# Patient Record
Sex: Female | Born: 1980 | Race: Black or African American | Hispanic: No | Marital: Single | Smoking: Former smoker
Health system: Southern US, Community
[De-identification: ages and names within clinical notes are randomized; demographics above are authoritative.]

## PROBLEM LIST (undated history)

## (undated) ENCOUNTER — Inpatient Hospital Stay: Payer: Self-pay

## (undated) DIAGNOSIS — I499 Cardiac arrhythmia, unspecified: Secondary | ICD-10-CM

## (undated) DIAGNOSIS — F3130 Bipolar disorder, current episode depressed, mild or moderate severity, unspecified: Secondary | ICD-10-CM

## (undated) DIAGNOSIS — F411 Generalized anxiety disorder: Secondary | ICD-10-CM

## (undated) DIAGNOSIS — F1210 Cannabis abuse, uncomplicated: Secondary | ICD-10-CM

## (undated) DIAGNOSIS — O09529 Supervision of elderly multigravida, unspecified trimester: Secondary | ICD-10-CM

## (undated) DIAGNOSIS — F4310 Post-traumatic stress disorder, unspecified: Secondary | ICD-10-CM

## (undated) HISTORY — PX: NO PAST SURGERIES: SHX2092

---

## 2013-07-25 ENCOUNTER — Emergency Department: Payer: Self-pay | Admitting: Emergency Medicine

## 2014-02-04 ENCOUNTER — Emergency Department: Payer: Self-pay | Admitting: Emergency Medicine

## 2015-02-16 ENCOUNTER — Emergency Department
Admission: EM | Admit: 2015-02-16 | Discharge: 2015-02-16 | Disposition: A | Discharge status: Home / Self Care | Payer: Self-pay | Attending: Emergency Medicine | Admitting: Emergency Medicine

## 2015-02-16 ENCOUNTER — Encounter: Payer: Self-pay | Admitting: Emergency Medicine

## 2015-02-16 DIAGNOSIS — Z3202 Encounter for pregnancy test, result negative: Secondary | ICD-10-CM | POA: Insufficient documentation

## 2015-02-16 DIAGNOSIS — F32A Depression, unspecified: Secondary | ICD-10-CM

## 2015-02-16 DIAGNOSIS — F329 Major depressive disorder, single episode, unspecified: Secondary | ICD-10-CM | POA: Insufficient documentation

## 2015-02-16 DIAGNOSIS — F419 Anxiety disorder, unspecified: Secondary | ICD-10-CM | POA: Insufficient documentation

## 2015-02-16 DIAGNOSIS — Z720 Tobacco use: Secondary | ICD-10-CM | POA: Insufficient documentation

## 2015-02-16 DIAGNOSIS — Z79899 Other long term (current) drug therapy: Secondary | ICD-10-CM | POA: Insufficient documentation

## 2015-02-16 HISTORY — DX: Post-traumatic stress disorder, unspecified: F43.10

## 2015-02-16 HISTORY — DX: Cardiac arrhythmia, unspecified: I49.9

## 2015-02-16 LAB — COMPREHENSIVE METABOLIC PANEL
ALT: 12 U/L — AB (ref 14–54)
AST: 18 U/L (ref 15–41)
Albumin: 4.7 g/dL (ref 3.5–5.0)
Alkaline Phosphatase: 47 U/L (ref 38–126)
Anion gap: 11 (ref 5–15)
BILIRUBIN TOTAL: 0.7 mg/dL (ref 0.3–1.2)
BUN: 11 mg/dL (ref 6–20)
CHLORIDE: 105 mmol/L (ref 101–111)
CO2: 22 mmol/L (ref 22–32)
Calcium: 9.5 mg/dL (ref 8.9–10.3)
Creatinine, Ser: 0.86 mg/dL (ref 0.44–1.00)
GFR calc Af Amer: >60 mL/min (ref >60)
Glucose, Bld: 84 mg/dL (ref 65–99)
Potassium: 3.3 mmol/L — ABNORMAL LOW (ref 3.5–5.1)
Sodium: 138 mmol/L (ref 135–145)
Total Protein: 8.6 g/dL — ABNORMAL HIGH (ref 6.5–8.1)

## 2015-02-16 LAB — URINALYSIS COMPLETE WITH MICROSCOPIC (ARMC ONLY)
Bacteria, UA: NONE SEEN
Bilirubin Urine: NEGATIVE
Glucose, UA: NEGATIVE mg/dL
HGB URINE DIPSTICK: NEGATIVE
Leukocytes, UA: NEGATIVE
Nitrite: NEGATIVE
PH: 6 (ref 5.0–8.0)
Protein, ur: NEGATIVE mg/dL
Specific Gravity, Urine: 1.013 (ref 1.005–1.030)

## 2015-02-16 LAB — CBC
HEMATOCRIT: 40.2 % (ref 35.0–47.0)
HEMOGLOBIN: 13.1 g/dL (ref 12.0–16.0)
MCH: 27.9 pg (ref 26.0–34.0)
MCHC: 32.6 g/dL (ref 32.0–36.0)
MCV: 85.5 fL (ref 80.0–100.0)
Platelets: 216 10*3/uL (ref 150–440)
RBC: 4.7 MIL/uL (ref 3.80–5.20)
RDW: 14.8 % — ABNORMAL HIGH (ref 11.5–14.5)
WBC: 7 10*3/uL (ref 3.6–11.0)

## 2015-02-16 LAB — URINE DRUG SCREEN, QUALITATIVE (ARMC ONLY)
Amphetamines, Ur Screen: NOT DETECTED
Barbiturates, Ur Screen: NOT DETECTED
Benzodiazepine, Ur Scrn: NOT DETECTED
CANNABINOID 50 NG, UR ~~LOC~~: POSITIVE — AB
Cocaine Metabolite,Ur ~~LOC~~: POSITIVE — AB
MDMA (ECSTASY) UR SCREEN: NOT DETECTED
Methadone Scn, Ur: NOT DETECTED
Opiate, Ur Screen: NOT DETECTED
Phencyclidine (PCP) Ur S: NOT DETECTED
TRICYCLIC, UR SCREEN: NOT DETECTED

## 2015-02-16 LAB — ACETAMINOPHEN LEVEL

## 2015-02-16 LAB — PREGNANCY, URINE: Preg Test, Ur: NEGATIVE

## 2015-02-16 LAB — ETHANOL

## 2015-02-16 LAB — SALICYLATE LEVEL

## 2015-02-16 MED ORDER — LORAZEPAM 1 MG PO TABS
1.0000 mg | ORAL_TABLET | Freq: Once | ORAL | Status: AC
  Administered 2015-02-16: 1 mg via ORAL

## 2015-02-16 MED ORDER — LORAZEPAM 1 MG PO TABS
ORAL_TABLET | ORAL | Status: AC
  Administered 2015-02-16: 1 mg via ORAL
  Filled 2015-02-16: qty 1

## 2015-02-16 NOTE — ED Notes (Signed)
Pt presents to ER alert and in NAD. Pt states she is having palpitations and CP. Pt reports depression and decreased appetite.
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
## 2015-02-16 NOTE — ED Notes (Signed)
Patient states that she is feeling better and calmer now

## 2015-02-16 NOTE — Discharge Instructions (Signed)
Please seek medical attention and help for any thoughts about wanting to harm herself, harm others, any concerning change in behavior, severe depression, inappropriate drug use or any other new or concerning symptoms.  Depression Depression is feeling sad, low, down in the dumps, blue, gloomy, or empty. In general, there are two kinds of depression:  Normal sadness or grief. This can happen after something upsetting. It often goes away on its own within 2 weeks. After losing a loved one (bereavement), normal sadness and grief may last longer than two weeks. It usually gets better with time.  Clinical depression. This kind lasts longer than normal sadness or grief. It keeps you from doing the things you normally do in life. It is often hard to function at home, work, or at school. It may affect your relationships with others. Treatment is often needed. GET HELP RIGHT AWAY IF:  You have thoughts about hurting yourself or others.  You lose touch with reality (psychotic symptoms). You may:  See or hear things that are not real.  Have untrue beliefs about your life or people around you.  Your medicine is giving you problems. MAKE SURE YOU:  Understand these instructions.  Will watch your condition.  Will get help right away if you are not doing well or get worse. Document Released: 10/08/2010 Document Revised: 01/20/2014 Document Reviewed: 01/05/2012 [facility] Patient Information 2015 [place], [place]. This information is not intended to replace advice given to you by your health care provider. Make sure you discuss any questions you have with your health care provider.

## 2015-02-16 NOTE — ED Notes (Signed)
Pt alert and oriented X4, active, cooperative, pt in NAD. RR even and unlabored, color WNL.  Pt informed to return if any life threatening symptoms occur.  Pt leaving with family per report. Pt refuses repeat VS before discharge.

## 2015-02-16 NOTE — ED Notes (Signed)
Pt says she has been taking her paxil and an herbal med, but for last two days has been out of control.  Cannot sleep--afraid to close eyes, also cannot eat. Pt tearful.  Says she sees lincoln in [place] for this.

## 2015-02-17 ENCOUNTER — Telehealth: Payer: Self-pay | Admitting: Emergency Medicine

## 2015-02-17 NOTE — ED Notes (Signed)
Pt says she did not get her work note for yesterday and today.  Will leave note at stat desk and she will pick up tomorrow.

## 2015-09-20 NOTE — L&D Delivery Note (Signed)
Delivery Note  EGA: 37.1  At 10:07 AM a viable female was delivered via Vaginal, Spontaneous Delivery (Presentation: LOA).  APGAR: 8, 8; weight 6 lb 9.5 oz (2990 g).   Placenta status: Intact, Spontaneous.  Cord: 3 vessels with the following complications: none apparent.  Cord pH: n/a  Anesthesia: Epidural  Episiotomy:  none Lacerations: 1st degree - shallow, not repaired Suture Repair: n/a Est. Blood Loss (mL):  150cc  Mom to postpartum.  Baby to Couplet care / Skin to Skin.  Mom presented to L&D with spontaneous rupture of membranes and labor.  She requested an epidural, and progressed to complete.  She had a <1hr second stage and delivered a viable female "Ariah" without difficulty.  Baby was placed on mom's chest and after 60 seconds of delayed cord clamping, it was doubly clamped and mom cut the cord!  Cord blood was collected.  Pitocin was started, and placenta delivered spontaneously, intact.  A shallow and hemostatic first degree laceration was noted but not repaired.  After attention from the pediatric team, the baby was placed on mom's chest for skin-to-skin.   Sunya Humbarger C Anaily Ashbaugh 03/27/2016, 11:36 PM

## 2015-10-21 ENCOUNTER — Encounter: Payer: Self-pay | Admitting: Emergency Medicine

## 2015-10-21 ENCOUNTER — Emergency Department
Admission: EM | Admit: 2015-10-21 | Discharge: 2015-10-21 | Disposition: A | Discharge status: Home / Self Care | Payer: Medicaid Other | Attending: Emergency Medicine | Admitting: Emergency Medicine

## 2015-10-21 ENCOUNTER — Emergency Department: Payer: Medicaid Other

## 2015-10-21 DIAGNOSIS — Z87891 Personal history of nicotine dependence: Secondary | ICD-10-CM | POA: Insufficient documentation

## 2015-10-21 DIAGNOSIS — O200 Threatened abortion: Secondary | ICD-10-CM | POA: Diagnosis not present

## 2015-10-21 DIAGNOSIS — Z3A12 12 weeks gestation of pregnancy: Secondary | ICD-10-CM | POA: Diagnosis not present

## 2015-10-21 DIAGNOSIS — N939 Abnormal uterine and vaginal bleeding, unspecified: Secondary | ICD-10-CM

## 2015-10-21 DIAGNOSIS — O209 Hemorrhage in early pregnancy, unspecified: Secondary | ICD-10-CM | POA: Diagnosis present

## 2015-10-21 LAB — COMPREHENSIVE METABOLIC PANEL
ALBUMIN: 3.5 g/dL (ref 3.5–5.0)
ALT: 12 U/L — AB (ref 14–54)
ANION GAP: 6 (ref 5–15)
AST: 16 U/L (ref 15–41)
Alkaline Phosphatase: 39 U/L (ref 38–126)
BUN: 5 mg/dL — ABNORMAL LOW (ref 6–20)
CO2: 22 mmol/L (ref 22–32)
Calcium: 8.7 mg/dL — ABNORMAL LOW (ref 8.9–10.3)
Chloride: 107 mmol/L (ref 101–111)
Creatinine, Ser: 0.52 mg/dL (ref 0.44–1.00)
GFR calc Af Amer: >60 mL/min (ref >60)
GFR calc non Af Amer: >60 mL/min (ref >60)
Glucose, Bld: 88 mg/dL (ref 65–99)
Potassium: 3.4 mmol/L — ABNORMAL LOW (ref 3.5–5.1)
Sodium: 135 mmol/L (ref 135–145)
Total Bilirubin: 0.4 mg/dL (ref 0.3–1.2)
Total Protein: 6.7 g/dL (ref 6.5–8.1)

## 2015-10-21 LAB — CBC WITH DIFFERENTIAL/PLATELET
BASOS PCT: 0 %
Basophils Absolute: 0 10*3/uL (ref 0–0.1)
EOS ABS: 0.3 10*3/uL (ref 0–0.7)
EOS PCT: 5 %
HCT: 30.3 % — ABNORMAL LOW (ref 35.0–47.0)
HEMOGLOBIN: 10.2 g/dL — AB (ref 12.0–16.0)
Lymphocytes Relative: 15 %
Lymphs Abs: 1.1 10*3/uL (ref 1.0–3.6)
MCH: 28.8 pg (ref 26.0–34.0)
MCHC: 33.6 g/dL (ref 32.0–36.0)
MCV: 85.8 fL (ref 80.0–100.0)
Monocytes Absolute: 0.5 10*3/uL (ref 0.2–0.9)
Monocytes Relative: 7 %
NEUTROS PCT: 73 %
Neutro Abs: 5.6 10*3/uL (ref 1.4–6.5)
Platelets: 204 10*3/uL (ref 150–440)
RBC: 3.52 MIL/uL — AB (ref 3.80–5.20)
RDW: 15.8 % — ABNORMAL HIGH (ref 11.5–14.5)
WBC: 7.6 10*3/uL (ref 3.6–11.0)

## 2015-10-21 LAB — URINALYSIS COMPLETE WITH MICROSCOPIC (ARMC ONLY)
Bilirubin Urine: NEGATIVE
Glucose, UA: NEGATIVE mg/dL
HGB URINE DIPSTICK: NEGATIVE
KETONES UR: NEGATIVE mg/dL
LEUKOCYTES UA: NEGATIVE
Nitrite: NEGATIVE
PH: 8 (ref 5.0–8.0)
PROTEIN: NEGATIVE mg/dL
SPECIFIC GRAVITY, URINE: 1.013 (ref 1.005–1.030)

## 2015-10-21 LAB — ABO/RH: ABO/RH(D): O POS

## 2015-10-21 LAB — HCG, QUANTITATIVE, PREGNANCY: HCG, BETA CHAIN, QUANT, S: 14626 m[IU]/mL — AB (ref <5)

## 2015-10-21 NOTE — Discharge Instructions (Signed)
Vaginal Bleeding During Pregnancy, Second Trimester ° A small amount of bleeding (spotting) from the vagina is common in pregnancy. Sometimes the bleeding is normal and is not a problem, and sometimes it is a sign of something serious. Be sure to tell your doctor about any bleeding from your vagina right away. °HOME CARE °· Watch your condition for any changes. °· Follow your doctor's instructions about how active you can be. °· If you are on bed rest: °¨ You may need to stay in bed and only get up to use the bathroom. °¨ You may be allowed to do some activities. °¨ If you need help, make plans for someone to help you. °· Write down: °¨ The number of pads you use each day. °¨ How often you change pads. °¨ How soaked (saturated) your pads are. °· Do not use tampons. °· Do not douche. °· Do not have sex or orgasms until your doctor says it is okay. °· If you pass any tissue from your vagina, save the tissue so you can show it to your doctor. °· Only take medicines as told by your doctor. °· Do not take aspirin because it can make you bleed. °· Do not exercise, lift heavy weights, or do any activities that take a lot of energy and effort unless your doctor says it is okay. °· Keep all follow-up visits as told by your doctor. °GET HELP IF:  °· You bleed from your vagina. °· You have cramps. °· You have labor pains. °· You have a fever that does not go away after you take medicine. °GET HELP RIGHT AWAY IF: °· You have very bad cramps in your back or belly (abdomen). °· You have contractions. °· You have chills. °· You pass large clots or tissue from your vagina. °· You bleed more. °· You feel light-headed or weak. °· You pass out (faint). °· You are leaking fluid or have a gush of fluid from your vagina. °MAKE SURE YOU: °· Understand these instructions. °· Will watch your condition. °· Will get help right away if you are not doing well or get worse. °  °This information is not intended to replace advice given to you by  your health care provider. Make sure you discuss any questions you have with your health care provider. °  °Document Released: 01/20/2014 Document Reviewed: 01/20/2014 °Elsevier Interactive Patient Education ©2016 [employer] ° °

## 2015-10-21 NOTE — ED Notes (Signed)
Pt to ed with c/o vaginal bleeding,  Pt is spprox [redacted] weeks pregnant and has not been evaluated by OBGYN yet.  Pt reports abd pain and cramping and bleeding that started this am.

## 2015-10-21 NOTE — ED Notes (Signed)
Pt returned from ultrasound

## 2015-10-21 NOTE — ED Notes (Signed)
Patient transported to Ultrasound 

## 2015-10-21 NOTE — ED Provider Notes (Signed)
[facility] Emergency Department Provider Note     Time seen: ----------------------------------------- 8:38 AM on 10/21/2015 -----------------------------------------    I have reviewed the triage vital signs and the nursing notes.   HISTORY  Chief Complaint Abdominal Pain and Vaginal Bleeding    HPI Pamela Mcintyre is a 35 y.o. female who presents ER with vaginal bleeding. Patient states she is around 34 pregnant has not been evaluated by OB/GYN yet. Does report abdominal pain and cramping and bleeding that started this morning.Patient is G5 P3 AB 2. Prior twin pregnancy. She denies fevers chills or other complaints.   Past Medical History  Diagnosis Date  . PTSD (post-traumatic stress disorder)   . Irregular heart beat     There are no active problems to display for this patient.   History reviewed. No pertinent past surgical history.  Allergies Review of patient's allergies indicates no known allergies.  Social History Social History  Substance Use Topics  . Smoking status: Former Research scientist (life sciences)  . Smokeless tobacco: None  . Alcohol Use: No    Review of Systems Constitutional: Negative for fever. Eyes: Negative for visual changes. ENT: Negative for sore throat. Cardiovascular: Negative for chest pain. Respiratory: Negative for shortness of breath. Gastrointestinal: Positive for abdominal pain Genitourinary: Negative for dysuria. Positive for vaginal bleeding Musculoskeletal: Negative for back pain. Skin: Negative for rash. Neurological: Negative for headaches, focal weakness or numbness.  10-point ROS otherwise negative.  ____________________________________________   PHYSICAL EXAM:  VITAL SIGNS: ED Triage Vitals  Enc Vitals Group     BP 10/21/15 0831 125/70 mmHg     Pulse Rate 10/21/15 0831 89     Resp 10/21/15 0831 20     Temp 10/21/15 0831 98.2 F (36.8 C)     Temp Source 10/21/15 0831 Oral     SpO2 10/21/15 0831 100 %      Weight 10/21/15 0831 120 lb (54.432 kg)     Height 10/21/15 0831 5\' 4"  (1.626 m)     Head Cir --      Peak Flow --      Pain Score 10/21/15 0832 8     Pain Loc --      Pain Edu? --      Excl. in [place]? --     Constitutional: Alert and oriented. Well appearing and in no distress. Eyes: Conjunctivae are normal. PERRL. Normal extraocular movements. ENT   Head: Normocephalic and atraumatic.   Nose: No congestion/rhinnorhea.   Mouth/Throat: Mucous membranes are moist.   Neck: No stridor. Cardiovascular: Normal rate, regular rhythm. Normal and symmetric distal pulses are present in all extremities. No murmurs, rubs, or gallops. Respiratory: Normal respiratory effort without tachypnea nor retractions. Breath sounds are clear and equal bilaterally. No wheezes/rales/rhonchi. Gastrointestinal: Mild lower abdominal tenderness, no rebound or guarding. Normal bowel sounds. Musculoskeletal: Nontender with normal range of motion in all extremities. No joint effusions.  No lower extremity tenderness nor edema. Neurologic:  Normal speech and language. No gross focal neurologic deficits are appreciated. Speech is normal. No gait instability. Skin:  Skin is warm, dry and intact. No rash noted. Psychiatric: Mood and affect are normal. Speech and behavior are normal. Patient exhibits appropriate insight and judgment. ____________________________________________  ED COURSE:  Pertinent labs & imaging results that were available during my care of the patient were reviewed by me and considered in my medical decision making (see chart for details). I'll check basic pregnancy labs and likely need an ultrasound ____________________________________________  LABS (pertinent positives/negatives)  Labs Reviewed  CBC WITH DIFFERENTIAL/PLATELET - Abnormal; Notable for the following:    RBC 3.52 (*)    Hemoglobin 10.2 (*)    HCT 30.3 (*)    RDW 15.8 (*)    All other components within normal limits   COMPREHENSIVE METABOLIC PANEL - Abnormal; Notable for the following:    Potassium 3.4 (*)    BUN 5 (*)    Calcium 8.7 (*)    ALT 12 (*)    All other components within normal limits  URINALYSIS COMPLETEWITH MICROSCOPIC ([facility] ONLY) - Abnormal; Notable for the following:    Color, Urine YELLOW (*)    APPearance CLEAR (*)    Bacteria, UA RARE (*)    Squamous Epithelial / LPF 6-30 (*)    All other components within normal limits  HCG, QUANTITATIVE, PREGNANCY - Abnormal; Notable for the following:    hCG, Beta Chain, Quant, S 14626 (*)    All other components within normal limits  ABO/RH    RADIOLOGY  Pregnancy ultrasound IMPRESSION: There is single live intrauterine gestation in breech presentation. Femur length measures 1.6 cm corresponding to gestational age [redacted] weeks 4 days. There is no placenta previa. EDC by ultrasound 04/16/2016. Unremarkable adnexa. Fetal heart rate 160 BPM  This exam is performed on an emergent basis and does not comprehensively evaluate fetal size, dating, or anatomy; follow-up complete OB US should be considered if further fetal assessment is warranted. ____________________________________________  FINAL ASSESSMENT AND PLAN  Threatened miscarriage  Plan: Patient with labs and imaging as dictated above. Unclear etiology for bleeding in the second trimester. She'll be referred to OB/GYN for close follow-up.   Earleen Newport, MD   Earleen Newport, MD 10/21/15 [phone]

## 2015-10-21 NOTE — ED Notes (Signed)
E-signature box not working. Pt verbalized understanding of discharge instructions. 

## 2015-10-29 LAB — OB RESULTS CONSOLE HIV ANTIBODY (ROUTINE TESTING): HIV: NONREACTIVE

## 2015-10-30 LAB — OB RESULTS CONSOLE RPR
RPR: NONREACTIVE
RPR: NONREACTIVE

## 2015-10-30 LAB — OB RESULTS CONSOLE HEPATITIS B SURFACE ANTIGEN: HEP B S AG: NEGATIVE

## 2015-11-01 LAB — OB RESULTS CONSOLE GC/CHLAMYDIA
Chlamydia: NEGATIVE
Gonorrhea: NEGATIVE

## 2015-11-05 ENCOUNTER — Ambulatory Visit
Admission: RE | Admit: 2015-11-05 | Discharge: 2015-11-05 | Disposition: A | Discharge status: Home / Self Care | Payer: Medicaid Other | Source: Ambulatory Visit | Attending: Maternal & Fetal Medicine | Admitting: Maternal & Fetal Medicine

## 2015-11-05 VITALS — BP 127/57 | HR 92 | Temp 98.8°F | Resp 18 | Wt 131.4 lb

## 2015-11-05 DIAGNOSIS — O289 Unspecified abnormal findings on antenatal screening of mother: Secondary | ICD-10-CM

## 2015-11-05 DIAGNOSIS — Z8279 Family history of other congenital malformations, deformations and chromosomal abnormalities: Secondary | ICD-10-CM | POA: Diagnosis not present

## 2015-11-05 DIAGNOSIS — O3482 Maternal care for other abnormalities of pelvic organs, second trimester: Secondary | ICD-10-CM | POA: Insufficient documentation

## 2015-11-05 DIAGNOSIS — O359XX0 Maternal care for (suspected) fetal abnormality and damage, unspecified, not applicable or unspecified: Secondary | ICD-10-CM

## 2015-11-05 DIAGNOSIS — Z3A16 16 weeks gestation of pregnancy: Secondary | ICD-10-CM | POA: Insufficient documentation

## 2015-11-05 NOTE — Progress Notes (Addendum)
Referring Provider:   [facility] Department Length of Consultation: 50 minutes  Ms. Pujols was referred to the [facility] for genetic counseling and targeted ultrasound because of a previous pregnancy with partial monosomy 13, an increased risk for Trisomy 109 by the maternal serum prenatal screen and advanced maternal age.  This note summarizes the information we discussed.   We first reviewed the records on the Ms. Marlowe's prior pregnancy from 2005.  That pregnancy was found to have multiple anomalies on second trimester ultrasound which led to an elective induction of labor at approximately [redacted] weeks gestation.  The baby, Ernestine Mcmurray, was found to have clefting, CNS anomalies, ambiguous genitalia, increased nuchal fold and absent thumbs on ultrasound.  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  The patient had genetic counseling at that time and was informed that this could occur as a de novo event in that pregnancy or could be the result of a parental chromosome translocation.  Ms. Larrow had chromosome analysis showing normal female results (46,XX).  The father of the baby, Dola Factor, declined testing at that time due to insurance issues.  Because we do not have results to rule out a chromosome translocation in him, it is difficult to provide an accurate recurrence risk estimate at this time.  He also declines chromosome analysis today. The couple then had a twin pregnancy resulting in the birth of two healthy daughters.  Ms. Boldon also reported two early miscarriages since the twins were born.  Next, we reviewed the chance for chromosome conditions as it relates to advancing maternal age.  We explained that the chance of a chromosome abnormality increases with maternal age.  Chromosomes and examples of chromosome problems were reviewed.  Humans typically have 46 chromosomes in each cell, with half  passed through each sperm and egg.  Any change in the number or structure of chromosomes can increase the risk of problems in the physical and mental development of a pregnancy.  Based upon age of the patient, the chance of any chromosome abnormality is estimated to be 1 in 65. The chance of Down syndrome, the most common chromosome problem associated with maternal age, was 1 in 67.  The risk of chromosome problems is in addition to the 3% general population risk for birth defects and mental retardation.  The greatest chance, of course, is that the baby would be born in good health.  Lastly, we provided background information on the maternal serum prenatal screen.  It was explained that this is a maternal blood test performed between the 14th and 21st week of pregnancy which measures several pregnancy proteins.  The levels of these proteins can help determine if a pregnancy is at high risk for certain birth defects or problems.  However, it cannot diagnose or rule out these defects.  An abnormal maternal serum screen does not necessarily mean that the baby has a problem.  Maternal serum screening can identify approximately 80% of neural tube defects, up to 75% of Down syndrome cases and 60% of trisomy 18 cases.  The neural tube consists of the fetal head and spine and if this structure fails to close during development, spina bifida (open spine) or anencephaly (open skull) could result.  Chromosomes are the inherited structures that contain our instructions for development (genes).  Each cell in our body normally has 46 chromosomes.  Rarely, when an egg and sperm unite, an extra or missing chromosome can be  passed on to the baby by mistake.  These types of chromosome problems typically cause mental retardation and might also cause birth defects.  We discussed Down syndrome (an extra chromosome #21) and trisomy 49 (an extra chromosome #18).    The maternal serum screen revealed protein levels that increased the  chance of Trisomy 18 syndrome in the pregnancy.  Given the maternal serum screen results, the chance is now estimated to be 1 in 41.  This means that the chance the baby does not have Trisomy 18 is greater than 97 percent.  The following prenatal testing options for this pregnancy:  Targeted ultrasound uses high frequency sound waves to create an image of the developing fetus.  An ultrasound is often recommended as a routine means of evaluating the pregnancy.  It is also used to screen for fetal anatomy problems (for example, a heart defect) that might be suggestive of a chromosomal or other abnormality.    Amniocentesis involves the removal of a small amount of amniotic fluid from the sac surrounding the fetus with the use of a thin needle inserted through the maternal abdomen and uterus.  Ultrasound guidance is used throughout the procedure.  Fetal cells are directly evaluated and > 98% of neural tube defects can be detected.  The main risks to this procedure include complications leading to miscarriage in less than 1 in 200 cases (0.5%).    We also reviewed the availability of cell free fetal DNA testing from maternal blood to determine whether or not the baby may have Down syndrome, trisomy 64, or trisomy 66.  This test utilizes a maternal blood sample and DNA sequencing technology to isolate circulating cell free fetal DNA from maternal plasma.  The fetal DNA can then be analyzed for DNA sequences that are derived from the three most common chromosomes involved in aneuploidy, chromosomes 13, 18, and 21.  If the overall amount of DNA is greater than the expected level for any of these chromosomes, aneuploidy is suspected.  While we do not consider it a replacement for invasive testing and karyotype analysis, a negative result from this testing would be reassuring, though not a guarantee of a normal chromosome complement for the baby.  An abnormal result is certainly suggestive of an abnormal chromosome  complement, though we would still recommend CVS or amniocentesis to confirm any findings from this testing.  We reviewed the detailed family history from her prior genetic counseling visit in 2007.  The couple reviewed the history of Kenyatta's nephew with physical and developmental differences related to a congenital brain tumor.  Ms. Partipilo reported one paternal first cousin with Down syndrome and one paternal aunt with apparently isolated cleft lip.  Cleft lip in the absence of a genetic syndrome or other birth defects is likely to have a low recurrent risk for third degree relatives, though given the history of clefting in [place], views of the nose/lips were visualized and were normal.  Most cases of Down syndrome are the result of nondisjunction and would be unlikely to increase the recurrence risk in this pregnancy.  Less than 5% of cases are the result of a chromosome translocation in a parent.  Given the normal results of her chromosome testing, we would not be concerned about a translocation even if that cousin had the translocation type of Dowy syndrome.   Ms. Beland reported no complications or exposures in the current pregnancy that would be expected to increase the risk for birth defects.  After consideration of  the options, Ms. Zammit elected to proceed with a detailed ultrasound and InformaSeq testing.  The ultrasound at the time of the visit confirmed the gestational age to be 78 weeks.  The detailed fetal anatomy was seen and appeared normal within the limits of ultrasound.  No soft markers or anomalies were seen that would suggest aneuploidy.  However, it is important to remember that not all birth defects or chromosome conditions can be detected by prenatal ultrasound. See that ultrasound report for details of that study. We scheduled the patient to return to our clinic in 4 weeks for a follow up ultrasound to reassess anatomy and growth.    The patient was encouraged to call with  questions or concerns. We can be contacted at [phone].  Wilburt Finlay, MS, CGC   I was immediately available and supervising. Erasmo Score, MD [facility]

## 2015-11-16 LAB — INFORMASEQ(SM) WITH XY ANALYSIS
Fetal Fraction (%):: 9.6
Fetal Number: 1
Gestational Age at Collection: 16.7 weeks
Weight: 130 [lb_av]

## 2015-11-19 ENCOUNTER — Telehealth: Payer: Self-pay | Admitting: Obstetrics and Gynecology

## 2015-11-19 NOTE — Telephone Encounter (Signed)
We have tried to reach the patient to inform her of the results of her recent InformaSeq testing (performed at [place]) which yielded NEGATIVE results.  The patient's specimen showed DNA consistent with two copies of chromosomes 21, 18 and 13.  The sensitivity for trisomy 40, trisomy 26 and trisomy 61 using this testing are reported as 99.1%, 98.3% and 98.1% respectively.  Thus, while the results of this testing are highly accurate, they are not considered diagnostic at this time.  Should more definitive information be desired, the patient may still consider amniocentesis.   Of note the patient had a previous pregnancy affected with partial monosomy 13.  This testing is not expected to detect chromosome differences other than trisomy 13, 18, 21 and sex chromosome differences.  Amniocentesis would be available if Ms. Lamberty desires testing for the chromosome difference from her prior pregnancy.   As requested to know by the patient, sex chromosome analysis was included for this sample.  Results was consistent with a female fetus (XX). This is predicted with >97% accuracy.  A maternal serum AFP only should be considered if screening for neural tube defects is desired.  We have left a message for the patient to call us for these results.  We may be reached at [phone].  Wilburt Finlay, MS, CGC

## 2015-11-21 ENCOUNTER — Emergency Department
Admission: EM | Admit: 2015-11-21 | Discharge: 2015-11-21 | Disposition: A | Discharge status: Home / Self Care | Payer: Medicaid Other | Attending: Student | Admitting: Student

## 2015-11-21 ENCOUNTER — Encounter: Payer: Self-pay | Admitting: Emergency Medicine

## 2015-11-21 DIAGNOSIS — Z87891 Personal history of nicotine dependence: Secondary | ICD-10-CM | POA: Diagnosis not present

## 2015-11-21 DIAGNOSIS — Z3A18 18 weeks gestation of pregnancy: Secondary | ICD-10-CM | POA: Insufficient documentation

## 2015-11-21 DIAGNOSIS — Z79899 Other long term (current) drug therapy: Secondary | ICD-10-CM | POA: Insufficient documentation

## 2015-11-21 DIAGNOSIS — O99512 Diseases of the respiratory system complicating pregnancy, second trimester: Secondary | ICD-10-CM | POA: Insufficient documentation

## 2015-11-21 DIAGNOSIS — J069 Acute upper respiratory infection, unspecified: Secondary | ICD-10-CM | POA: Diagnosis not present

## 2015-11-21 DIAGNOSIS — O9989 Other specified diseases and conditions complicating pregnancy, childbirth and the puerperium: Secondary | ICD-10-CM | POA: Diagnosis present

## 2015-11-21 LAB — RAPID INFLUENZA A&B ANTIGENS (ARMC ONLY): INFLUENZA A (ARMC): NEGATIVE

## 2015-11-21 LAB — URINALYSIS COMPLETE WITH MICROSCOPIC (ARMC ONLY)
BACTERIA UA: NONE SEEN
Bilirubin Urine: NEGATIVE
GLUCOSE, UA: NEGATIVE mg/dL
HGB URINE DIPSTICK: NEGATIVE
LEUKOCYTES UA: NEGATIVE
NITRITE: NEGATIVE
Protein, ur: NEGATIVE mg/dL
RBC / HPF: NONE SEEN RBC/hpf (ref 0–5)
SPECIFIC GRAVITY, URINE: 1.019 (ref 1.005–1.030)
pH: 8 (ref 5.0–8.0)

## 2015-11-21 LAB — RAPID INFLUENZA A&B ANTIGENS: Influenza B (ARMC): NEGATIVE

## 2015-11-21 MED ORDER — IPRATROPIUM BROMIDE 0.03 % NA SOLN: 2.0000 | Freq: Three times a day (TID) | NASAL | Status: DC

## 2015-11-21 MED ORDER — DEXTROMETHORPHAN POLISTIREX ER 30 MG/5ML PO SUER: 30.0000 mg | Freq: Two times a day (BID) | ORAL | Status: DC

## 2015-11-21 NOTE — ED Notes (Signed)
Discussed discharge instructions, prescriptions, and follow-up care with patient. No questions or concerns at this time. Pt stable at discharge.  

## 2015-11-21 NOTE — ED Provider Notes (Signed)
[facility] Emergency Department Provider Note  ____________________________________________  Time seen: Approximately 10:57 AM  I have reviewed the triage vital signs and the nursing notes.   HISTORY  Chief Complaint Nasal Congestion    HPI Pamela Mcintyre is a 35 y.o. female who is [redacted] weeks pregnant presents with complaints of urinary frequency and sinus nasal head congestion and drainage since yesterday. Patient states that she's been taking Tylenol over-the-counter with no symptomatic relief. Denies any fever chills nausea vomiting. Appetite good drinking fluids well. Denies any abdominal pain or vaginal discharge or bleeding.   Past Medical History  Diagnosis Date  . PTSD (post-traumatic stress disorder)   . Irregular heart beat     Patient Active Problem List   Diagnosis Date Noted  . Abnormal finding on antenatal screening of mother 11/05/2015  . Family history of chromosomal abnormality 11/05/2015    Past Surgical History  Procedure Laterality Date  . No past surgeries      Current Outpatient Rx  Name  Route  Sig  Dispense  Refill  . acetaminophen (TYLENOL) 325 MG tablet   Oral   Take 650 mg by mouth every 6 (six) hours as needed.         Marland Kitchen dextromethorphan (DELSYM) 30 MG/5ML liquid   Oral   Take 5 mLs (30 mg total) by mouth 2 (two) times daily.   89 mL   0   . ferrous sulfate 325 (65 FE) MG tablet   Oral   Take 325 mg by mouth 2 (two) times daily with a meal.         . FLUoxetine (PROZAC) 20 MG tablet   Oral   Take 60 mg by mouth daily. Reported on 11/05/2015         . ibuprofen (ADVIL,MOTRIN) 200 MG tablet   Oral   Take 800 mg by mouth every 6 (six) hours as needed for moderate pain. Reported on 11/05/2015         . ipratropium (ATROVENT) 0.03 % nasal spray   Nasal   Place 2 sprays into the nose 3 (three) times daily.   30 mL   2   . Prenatal Vit-Fe Fumarate-FA (PRENATAL MULTIVITAMIN) TABS tablet   Oral   Take 1  tablet by mouth daily at 12 noon.           Allergies Review of patient's allergies indicates no known allergies.  No family history on file.  Social History Social History  Substance Use Topics  . Smoking status: Former Smoker -- 0.25 packs/day    Types: Cigarettes  . Smokeless tobacco: Never Used  . Alcohol Use: No    Review of Systems Constitutional: No fever/chills Eyes: No visual changes. ENT: No sore throat. Positive nasal sinus congestion Cardiovascular: Denies chest pain. Respiratory: Denies shortness of breath. Positive cough Gastrointestinal: No abdominal pain.  No nausea, no vomiting.  No diarrhea.  No constipation. Genitourinary: Negative for dysuria. Positive for polyuria. Musculoskeletal: Negative for back pain. Skin: Negative for rash. Neurological: Negative for headaches, focal weakness or numbness.  10-point ROS otherwise negative.  ____________________________________________   PHYSICAL EXAM:  VITAL SIGNS: ED Triage Vitals  Enc Vitals Group     BP 11/21/15 1037 126/69 mmHg     Pulse Rate 11/21/15 1037 98     Resp 11/21/15 1037 20     Temp 11/21/15 1037 98.3 F (36.8 C)     Temp Source 11/21/15 1037 Oral     SpO2 11/21/15  1037 99 %     Weight 11/21/15 1037 130 lb (58.968 kg)     Height 11/21/15 1037 5\' 4"  (1.626 m)     Head Cir --      Peak Flow --      Pain Score 11/21/15 1038 8     Pain Loc --      Pain Edu? --      Excl. in [place]? --     Constitutional: Alert and oriented. Well appearing and in no acute distress. Eyes: Conjunctivae are normal. PERRL. EOMI. Head: Atraumatic. Positive sinus final and maxillary tenderness bilaterally Nose: Positive congestion/rhinnorhea. Mouth/Throat: Mucous membranes are moist.  Oropharynx non-erythematous. Neck: No stridor. Full range of motion nontender  Cardiovascular: Normal rate, regular rhythm. Grossly normal heart sounds.  Good peripheral circulation. Respiratory: Normal respiratory effort.  No  retractions. Lungs CTAB. Musculoskeletal: No lower extremity tenderness nor edema.  No joint effusions. Neurologic:  Normal speech and language. No gross focal neurologic deficits are appreciated. No gait instability. Skin:  Skin is warm, dry and intact. No rash noted. Psychiatric: Mood and affect are normal. Speech and behavior are normal.  ____________________________________________   LABS (all labs ordered are listed, but only abnormal results are displayed)  Labs Reviewed  URINALYSIS COMPLETEWITH MICROSCOPIC ([place]) - Abnormal; Notable for the following:    Color, Urine YELLOW (*)    APPearance CLEAR (*)    Ketones, ur 1+ (*)    Squamous Epithelial / LPF 0-5 (*)    All other components within normal limits  RAPID INFLUENZA A&B ANTIGENS ([facility] ONLY)   ____________________________________________   PROCEDURES  Procedure(s) performed: None  Critical Care performed: No  ____________________________________________   INITIAL IMPRESSION / ASSESSMENT AND PLAN / ED COURSE  Pertinent labs & imaging results that were available during my care of the patient were reviewed by me and considered in my medical decision making (see chart for details).  Acute respiratory infection Rx given for Deltasone cough syrup over-the-counter and Atrovent nasal spray. Reassurance provided patient is follow-up with her OB doctor as needed or return to the ER when necessary. She voices no other emergency medical complaints at this time. ____________________________________________   FINAL CLINICAL IMPRESSION(S) / ED DIAGNOSES  Final diagnoses:  URI, acute     This chart was dictated using voice recognition software/Dragon. Despite best efforts to proofread, errors can occur which can change the meaning. Any change was purely unintentional.   Arlyss Repress, PA-C 11/21/15 [place] Gayle, MD 11/21/15 [phone]

## 2015-11-21 NOTE — Discharge Instructions (Signed)
Upper Respiratory Infection, Adult Most upper respiratory infections (URIs) are a viral infection of the air passages leading to the lungs. A URI affects the nose, throat, and upper air passages. The most common type of URI is nasopharyngitis and is typically referred to as "the common cold." URIs run their course and usually go away on their own. Most of the time, a URI does not require medical attention, but sometimes a bacterial infection in the upper airways can follow a viral infection. This is called a secondary infection. Sinus and middle ear infections are common types of secondary upper respiratory infections. Bacterial pneumonia can also complicate a URI. A URI can worsen asthma and chronic obstructive pulmonary disease (COPD). Sometimes, these complications can require emergency medical care and may be life threatening.  CAUSES Almost all URIs are caused by viruses. A virus is a type of germ and can spread from one person to another.  RISKS FACTORS You may be at risk for a URI if:   You smoke.   You have chronic heart or lung disease.  You have a weakened defense (immune) system.   You are very young or very old.   You have nasal allergies or asthma.  You work in crowded or poorly ventilated areas.  You work in health care facilities or schools. SIGNS AND SYMPTOMS  Symptoms typically develop 2-3 days after you come in contact with a cold virus. Most viral URIs last 7-10 days. However, viral URIs from the influenza virus (flu virus) can last 14-18 days and are typically more severe. Symptoms may include:   Runny or stuffy (congested) nose.   Sneezing.   Cough.   Sore throat.   Headache.   Fatigue.   Fever.   Loss of appetite.   Pain in your forehead, behind your eyes, and over your cheekbones (sinus pain).  Muscle aches.  DIAGNOSIS  Your health care provider may diagnose a URI by:  Physical exam.  Tests to check that your symptoms are not due to  another condition such as:  Strep throat.  Sinusitis.  Pneumonia.  Asthma. TREATMENT  A URI goes away on its own with time. It cannot be cured with medicines, but medicines may be prescribed or recommended to relieve symptoms. Medicines may help:  Reduce your fever.  Reduce your cough.  Relieve nasal congestion. HOME CARE INSTRUCTIONS   Take medicines only as directed by your health care provider.   Gargle warm saltwater or take cough drops to comfort your throat as directed by your health care provider.  Use a warm mist humidifier or inhale steam from a shower to increase air moisture. This may make it easier to breathe.  Drink enough fluid to keep your urine clear or pale yellow.   Eat soups and other clear broths and maintain good nutrition.   Rest as needed.   Return to work when your temperature has returned to normal or as your health care provider advises. You may need to stay home longer to avoid infecting others. You can also use a face mask and careful hand washing to prevent spread of the virus.  Increase the usage of your inhaler if you have asthma.   Do not use any tobacco products, including cigarettes, chewing tobacco, or electronic cigarettes. If you need help quitting, ask your health care provider. PREVENTION  The best way to protect yourself from getting a cold is to practice good hygiene.   Avoid oral or hand contact with people with cold   symptoms.   Wash your hands often if contact occurs.  There is no clear evidence that vitamin C, vitamin E, echinacea, or exercise reduces the chance of developing a cold. However, it is always recommended to get plenty of rest, exercise, and practice good nutrition.  SEEK MEDICAL CARE IF:   You are getting worse rather than better.   Your symptoms are not controlled by medicine.   You have chills.  You have worsening shortness of breath.  You have brown or red mucus.  You have yellow or brown nasal  discharge.  You have pain in your face, especially when you bend forward.  You have a fever.  You have swollen neck glands.  You have pain while swallowing.  You have white areas in the back of your throat. SEEK IMMEDIATE MEDICAL CARE IF:   You have severe or persistent:  Headache.  Ear pain.  Sinus pain.  Chest pain.  You have chronic lung disease and any of the following:  Wheezing.  Prolonged cough.  Coughing up blood.  A change in your usual mucus.  You have a stiff neck.  You have changes in your:  Vision.  Hearing.  Thinking.  Mood. MAKE SURE YOU:   Understand these instructions.  Will watch your condition.  Will get help right away if you are not doing well or get worse.   This information is not intended to replace advice given to you by your health care provider. Make sure you discuss any questions you have with your health care provider.   Document Released: 03/01/2001 Document Revised: 01/20/2015 Document Reviewed: 12/11/2013 Elsevier Interactive Patient Education 2016 Elsevier Inc.  

## 2015-11-21 NOTE — ED Notes (Signed)
Head congestion and drainage since yesterday. [redacted] weeks pregnant with no vag bleeding or other untoward symptoms related to pregnancy.

## 2015-12-07 ENCOUNTER — Inpatient Hospital Stay: Admission: RE | Admit: 2015-12-07 | Payer: Self-pay | Source: Ambulatory Visit

## 2015-12-07 ENCOUNTER — Other Ambulatory Visit: Payer: Self-pay

## 2015-12-07 DIAGNOSIS — Z8279 Family history of other congenital malformations, deformations and chromosomal abnormalities: Secondary | ICD-10-CM

## 2015-12-21 ENCOUNTER — Ambulatory Visit
Admission: RE | Admit: 2015-12-21 | Discharge: 2015-12-21 | Disposition: A | Discharge status: Home / Self Care | Payer: Medicaid Other | Source: Ambulatory Visit | Attending: Obstetrics & Gynecology | Admitting: Obstetrics & Gynecology

## 2015-12-21 VITALS — BP 129/58 | HR 90 | Temp 98.6°F | Resp 18 | Wt 142.4 lb

## 2015-12-21 DIAGNOSIS — O289 Unspecified abnormal findings on antenatal screening of mother: Secondary | ICD-10-CM

## 2015-12-21 DIAGNOSIS — Z36 Encounter for antenatal screening of mother: Secondary | ICD-10-CM | POA: Insufficient documentation

## 2015-12-21 DIAGNOSIS — Z3A23 23 weeks gestation of pregnancy: Secondary | ICD-10-CM | POA: Diagnosis not present

## 2015-12-21 DIAGNOSIS — O09512 Supervision of elderly primigravida, second trimester: Secondary | ICD-10-CM | POA: Diagnosis not present

## 2015-12-21 DIAGNOSIS — Z8279 Family history of other congenital malformations, deformations and chromosomal abnormalities: Secondary | ICD-10-CM | POA: Diagnosis present

## 2016-01-27 LAB — OB RESULTS CONSOLE HIV ANTIBODY (ROUTINE TESTING): HIV: NONREACTIVE

## 2016-01-29 LAB — OB RESULTS CONSOLE VARICELLA ZOSTER ANTIBODY, IGG: VARICELLA IGG: NON-IMMUNE/NOT IMMUNE

## 2016-01-29 LAB — OB RESULTS CONSOLE RUBELLA ANTIBODY, IGM: Rubella: IMMUNE

## 2016-03-21 ENCOUNTER — Observation Stay
Admission: EM | Admit: 2016-03-21 | Discharge: 2016-03-21 | Disposition: A | Discharge status: Home / Self Care | Payer: Medicaid Other | Attending: Certified Nurse Midwife | Admitting: Certified Nurse Midwife

## 2016-03-21 ENCOUNTER — Encounter: Payer: Self-pay | Admitting: *Deleted

## 2016-03-21 DIAGNOSIS — O99013 Anemia complicating pregnancy, third trimester: Secondary | ICD-10-CM | POA: Diagnosis not present

## 2016-03-21 DIAGNOSIS — F319 Bipolar disorder, unspecified: Secondary | ICD-10-CM | POA: Diagnosis not present

## 2016-03-21 DIAGNOSIS — R12 Heartburn: Secondary | ICD-10-CM | POA: Insufficient documentation

## 2016-03-21 DIAGNOSIS — O09523 Supervision of elderly multigravida, third trimester: Secondary | ICD-10-CM | POA: Insufficient documentation

## 2016-03-21 DIAGNOSIS — B019 Varicella without complication: Secondary | ICD-10-CM | POA: Diagnosis not present

## 2016-03-21 DIAGNOSIS — Z8279 Family history of other congenital malformations, deformations and chromosomal abnormalities: Secondary | ICD-10-CM

## 2016-03-21 DIAGNOSIS — R110 Nausea: Secondary | ICD-10-CM | POA: Diagnosis not present

## 2016-03-21 DIAGNOSIS — O4703 False labor before 37 completed weeks of gestation, third trimester: Principal | ICD-10-CM | POA: Insufficient documentation

## 2016-03-21 DIAGNOSIS — O0933 Supervision of pregnancy with insufficient antenatal care, third trimester: Secondary | ICD-10-CM | POA: Diagnosis not present

## 2016-03-21 DIAGNOSIS — I499 Cardiac arrhythmia, unspecified: Secondary | ICD-10-CM | POA: Diagnosis not present

## 2016-03-21 DIAGNOSIS — O99613 Diseases of the digestive system complicating pregnancy, third trimester: Secondary | ICD-10-CM | POA: Insufficient documentation

## 2016-03-21 DIAGNOSIS — O99343 Other mental disorders complicating pregnancy, third trimester: Secondary | ICD-10-CM | POA: Insufficient documentation

## 2016-03-21 DIAGNOSIS — O09213 Supervision of pregnancy with history of pre-term labor, third trimester: Secondary | ICD-10-CM | POA: Diagnosis not present

## 2016-03-21 DIAGNOSIS — O98513 Other viral diseases complicating pregnancy, third trimester: Secondary | ICD-10-CM | POA: Insufficient documentation

## 2016-03-21 DIAGNOSIS — D649 Anemia, unspecified: Secondary | ICD-10-CM | POA: Diagnosis not present

## 2016-03-21 DIAGNOSIS — O99323 Drug use complicating pregnancy, third trimester: Secondary | ICD-10-CM | POA: Diagnosis not present

## 2016-03-21 DIAGNOSIS — O99413 Diseases of the circulatory system complicating pregnancy, third trimester: Secondary | ICD-10-CM | POA: Diagnosis not present

## 2016-03-21 DIAGNOSIS — F4310 Post-traumatic stress disorder, unspecified: Secondary | ICD-10-CM | POA: Insufficient documentation

## 2016-03-21 DIAGNOSIS — O26893 Other specified pregnancy related conditions, third trimester: Secondary | ICD-10-CM | POA: Diagnosis not present

## 2016-03-21 DIAGNOSIS — Z3A36 36 weeks gestation of pregnancy: Secondary | ICD-10-CM | POA: Diagnosis not present

## 2016-03-21 DIAGNOSIS — O289 Unspecified abnormal findings on antenatal screening of mother: Secondary | ICD-10-CM

## 2016-03-21 DIAGNOSIS — F1210 Cannabis abuse, uncomplicated: Secondary | ICD-10-CM | POA: Diagnosis not present

## 2016-03-21 HISTORY — DX: Cannabis abuse, uncomplicated: F12.10

## 2016-03-21 HISTORY — DX: Supervision of elderly multigravida, unspecified trimester: O09.529

## 2016-03-21 HISTORY — DX: Generalized anxiety disorder: F41.1

## 2016-03-21 HISTORY — DX: Bipolar disorder, current episode depressed, mild or moderate severity, unspecified: F31.30

## 2016-03-21 LAB — URINALYSIS COMPLETE WITH MICROSCOPIC (ARMC ONLY)
BILIRUBIN URINE: NEGATIVE
GLUCOSE, UA: NEGATIVE mg/dL
Hgb urine dipstick: NEGATIVE
KETONES UR: NEGATIVE mg/dL
Leukocytes, UA: NEGATIVE
NITRITE: NEGATIVE
Protein, ur: NEGATIVE mg/dL
SPECIFIC GRAVITY, URINE: 1.014 (ref 1.005–1.030)
pH: 6 (ref 5.0–8.0)

## 2016-03-21 LAB — OB RESULTS CONSOLE GBS: STREP GROUP B AG: NEGATIVE

## 2016-03-21 LAB — URINE DRUG SCREEN, QUALITATIVE (ARMC ONLY)
AMPHETAMINES, UR SCREEN: NOT DETECTED
Barbiturates, Ur Screen: NOT DETECTED
Benzodiazepine, Ur Scrn: NOT DETECTED
CANNABINOID 50 NG, UR ~~LOC~~: NOT DETECTED
COCAINE METABOLITE, UR ~~LOC~~: NOT DETECTED
MDMA (ECSTASY) UR SCREEN: NOT DETECTED
Methadone Scn, Ur: NOT DETECTED
OPIATE, UR SCREEN: NOT DETECTED
PHENCYCLIDINE (PCP) UR S: NOT DETECTED
Tricyclic, Ur Screen: NOT DETECTED

## 2016-03-21 MED ORDER — FAMOTIDINE 20 MG PO TABS
ORAL_TABLET | ORAL | Status: AC
  Administered 2016-03-21: 20 mg via ORAL
  Filled 2016-03-21: qty 1

## 2016-03-21 MED ORDER — BETAMETHASONE SOD PHOS & ACET 6 (3-3) MG/ML IJ SUSP
12.0000 mg | Freq: Once | INTRAMUSCULAR | Status: AC
  Administered 2016-03-21: 12 mg via INTRAMUSCULAR
  Filled 2016-03-21: qty 1

## 2016-03-21 MED ORDER — FAMOTIDINE 20 MG PO TABS: 20.0000 mg | ORAL_TABLET | Freq: Once | ORAL | Status: DC

## 2016-03-21 NOTE — H&P (Signed)
OB History & Physical   History of Present Illness:  Chief Complaint:   Complains of onset contractions 3hrs PTA at 0100 accompanied by pelvic pressure and back pain.Has noticed more leakage of fluid or discharge over the last 2 days. C/o heartburn and a little nausea HPI:  Pamela Mcintyre is a 35 y.o. [phone] female with EDC=04/16/2016 at [redacted]w[redacted]d dated by a 14wk 4d ultrasound.  Her pregnancy has been complicated by bipolar disorder with major disorder, Generalized anxieth disorder, PTSD, marijuana abuse, hx of a second trimester abortion for a fetus with Rocky Morel and multiple anomalies, a positive quad screen this pregnancy with a negative Informaseq and anatomy scan, and a history of a preterm birth of twins at 25 weeks..  She presents to L&D for evaluation of labor.   She denies vaginal bleeding. Baby active  Prenatal care site: Prenatal care at [facility] has also been remarkable for late prenatal care, being varicella non immune and anemic. Plans on breast and bottle feeding. Desires a BTL and 30 day papers signed on both 4/27 and 01/26/2016. Received TDAP 01/26/2016     Maternal Medical History:   Past Medical History  Diagnosis Date  . PTSD (post-traumatic stress disorder)   . Irregular heart beat   . Bipolar disorder with current episode depressed ([place])   . Generalized anxiety disorder   . Advanced maternal age in multigravida   . Marijuana abuse   Anemia  Past Surgical History  Procedure Laterality Date  . No past surgeries      No Known Allergies  Prior to Admission medications   Medication Sig Start Date End Date Taking? Authorizing Provider  acetaminophen (TYLENOL) 325 MG tablet Take 650 mg by mouth every 6 (six) hours as needed.   Yes Historical Provider, MD  ferrous sulfate 325 (65 FE) MG tablet Take 325 mg by mouth 2 (two) times daily with a meal.   Yes Historical Provider, MD  FLUoxetine (PROZAC) 20 MG tablet Take 60 mg by mouth daily. Reported on 12/21/2015   Yes Historical  Provider, MD  Prenatal Vit-Fe Fumarate-FA (PRENATAL MULTIVITAMIN) TABS tablet Take 1 tablet by mouth daily at 12 noon.   Yes Historical Provider, MD  dextromethorphan (DELSYM) 30 MG/5ML liquid Take 5 mLs (30 mg total) by mouth 2 (two) times daily. Patient not taking: Reported on 03/21/2016 11/21/15   Pierce Crane Beers, PA-C  ibuprofen (ADVIL,MOTRIN) 200 MG tablet Take 800 mg by mouth every 6 (six) hours as needed for moderate pain. Reported on 03/21/2016    Historical Provider, MD  ipratropium (ATROVENT) 0.03 % nasal spray Place 2 sprays into the nose 3 (three) times daily. Patient not taking: Reported on 12/21/2015 11/21/15   Arlyss Repress, PA-C          Social History: She  reports that she has quit smoking. Her smoking use included Cigarettes. She smoked 0.25 packs per day. She has never used smokeless tobacco. She reports that she does not drink alcohol or use illicit drugs.  Family History: family history includes Bipolar disorder in her mother; Diabetes in her father; Hypertension in her father and mother; Thyroid disease in her mother.   Review of Systems: Negative x 10 systems reviewed except as noted in the HPI.      Physical Exam:  Vital Signs: 116/67 Pulse 81 General: no acute distress.  HEENT: normocephalic, atraumatic Heart: regular rate & rhythm.  No murmurs Lungs: clear to auscultation bilaterally Abdomen: soft, gravid, non-tender;  EFW: 6#1/2 Pelvic:  External: Normal external female genitalia  Cervix:: 1/50%/-1 to -2/ posterior  Wet [place]: - clue cells; - trichomonas;  -yeast  ROM: -pooling; -nitrazine; - ferning Extremities: non-tender, symmetric, no edema bilaterally.  DTRs:+1 Neurologic: Alert & oriented x 3.    Pertinent Results:  Prenatal Labs: Blood type/Rh O positive  Antibody screen negative  Rubella Varicella Immune Non immune  RPR Non reactive  HBsAg negative  HIV nonreactive  GC negative  Chlamydia negative  Genetic screening Positive quad screen  for Trisomy 18 Negative Informaseq  1 hour GTT 98  3 hour GTT NA  GBS done on 03/21/2016   Baseline FHR: 135 with accelerations to 150s to 160s, moderate variability Toco: contractions q 2-5 min apart  Assessment:  Pamela Mcintyre is a 35 y.o. [phone] female at [redacted]w[redacted]d with preterm contractions. R/O early labor  Plan:  1. Admit to Labor & Delivery for observation-monitor for progress 2. Clear liquids 3. GBS done  4. Consents obtained. 5. UA and UDS 6. Pepcid for heartburn  Vernecia Umble  03/21/2016 4:30 AM

## 2016-03-21 NOTE — Final Progress Note (Signed)
Physician Final Progress Note  Patient ID: Pamela Mcintyre MRN: [id number] DOB/AGE: 35/11/1980 35 y.o.  Admit date: 03/21/2016 Admitting provider: Will Bonnet, MD Discharge date: 03/21/2016   Admission Diagnoses: IUP at [place] 2d with preterm contractions   Discharge Diagnoses:  IUP at [facility] False labor vs prodromal labor  Consults: none  Significant Findings/ Diagnostic Studies: 35 year old G6 P42 presented at 57 wk 2d with contractions. Urine and wet prep were both negative for infection. UDS was negative. After 3.5 -4 hours of observation her cervix remained 1cm/50%/-2. Contractions were every 3-5 minutes but patient was able to rest through most of the contractions. FHR remained reactive. She opted to go home, and  she was given betamethasone 12 mgm IM and will return tomorrow for a second dose. Labor precautions were given.   Procedures: none  Discharge Condition: stable  Disposition: 01-Home or Self Care  Diet: Regular diet  Discharge Activity: Activity as tolerated     Medication List    ASK your doctor about these medications        acetaminophen 325 MG tablet  Commonly known as:  TYLENOL  Take 650 mg by mouth every 6 (six) hours as needed.     ferrous sulfate 325 (65 FE) MG tablet  Take 325 mg by mouth 2 (two) times daily with a meal.     FLUoxetine 20 MG tablet  Commonly known as:  PROZAC  Take 60 mg by mouth daily. Reported on 12/21/2015     prenatal multivitamin Tabs tablet  Take 1 tablet by mouth daily at 12 noon.         Total time spent taking care of this patient: 20 minutes  Signed: Delsie Amador 03/21/2016, 7:24 AM

## 2016-03-21 NOTE — Discharge Instructions (Signed)
Please get plenty of water and rest. Please return if contractions become 3-5 minutes apart or if you have leaking of fluids. Discharge instructions given and included.

## 2016-03-22 ENCOUNTER — Inpatient Hospital Stay
Admission: RE | Admit: 2016-03-22 | Discharge: 2016-03-22 | Disposition: A | Discharge status: Home / Self Care | Payer: Medicaid Other | Attending: Obstetrics & Gynecology | Admitting: Obstetrics & Gynecology

## 2016-03-22 DIAGNOSIS — Z3A36 36 weeks gestation of pregnancy: Secondary | ICD-10-CM | POA: Diagnosis not present

## 2016-03-22 DIAGNOSIS — Z3493 Encounter for supervision of normal pregnancy, unspecified, third trimester: Secondary | ICD-10-CM | POA: Diagnosis not present

## 2016-03-22 MED ORDER — BETAMETHASONE SOD PHOS & ACET 6 (3-3) MG/ML IJ SUSP: 12.0000 mg | Freq: Once | INTRAMUSCULAR | Status: DC

## 2016-03-22 MED ORDER — BETAMETHASONE SOD PHOS & ACET 6 (3-3) MG/ML IJ SUSP
INTRAMUSCULAR | Status: AC
Start: 2016-03-22 — End: 2016-03-22
  Administered 2016-03-22: 12 mg via INTRAMUSCULAR
  Filled 2016-03-22: qty 1

## 2016-03-22 NOTE — Progress Notes (Signed)
Pt. here with FOB for second BMZ injection.  Injection given in RG muscle.  Pt. tolerated procedure well.  Positive for fetal movement, verbalized no complains dealing with pregnancy.

## 2016-03-24 LAB — CULTURE, BETA STREP (GROUP B ONLY)

## 2016-03-26 ENCOUNTER — Encounter: Payer: Self-pay | Admitting: *Deleted

## 2016-03-26 ENCOUNTER — Observation Stay
Admission: EM | Admit: 2016-03-26 | Discharge: 2016-03-27 | Disposition: A | Discharge status: Home / Self Care | Payer: Medicaid Other | Source: Home / Self Care | Admitting: Obstetrics & Gynecology

## 2016-03-26 DIAGNOSIS — D649 Anemia, unspecified: Secondary | ICD-10-CM | POA: Diagnosis present

## 2016-03-26 DIAGNOSIS — F4310 Post-traumatic stress disorder, unspecified: Secondary | ICD-10-CM | POA: Diagnosis present

## 2016-03-26 DIAGNOSIS — O09523 Supervision of elderly multigravida, third trimester: Secondary | ICD-10-CM

## 2016-03-26 DIAGNOSIS — O99344 Other mental disorders complicating childbirth: Secondary | ICD-10-CM | POA: Diagnosis present

## 2016-03-26 DIAGNOSIS — O99324 Drug use complicating childbirth: Secondary | ICD-10-CM | POA: Diagnosis present

## 2016-03-26 DIAGNOSIS — O9902 Anemia complicating childbirth: Secondary | ICD-10-CM | POA: Diagnosis present

## 2016-03-26 DIAGNOSIS — F319 Bipolar disorder, unspecified: Secondary | ICD-10-CM | POA: Diagnosis present

## 2016-03-26 DIAGNOSIS — D259 Leiomyoma of uterus, unspecified: Secondary | ICD-10-CM | POA: Diagnosis present

## 2016-03-26 DIAGNOSIS — O3413 Maternal care for benign tumor of corpus uteri, third trimester: Principal | ICD-10-CM | POA: Diagnosis present

## 2016-03-26 DIAGNOSIS — Z3A37 37 weeks gestation of pregnancy: Secondary | ICD-10-CM

## 2016-03-26 DIAGNOSIS — Z87891 Personal history of nicotine dependence: Secondary | ICD-10-CM

## 2016-03-26 DIAGNOSIS — Z302 Encounter for sterilization: Secondary | ICD-10-CM

## 2016-03-26 DIAGNOSIS — Z91410 Personal history of adult physical and sexual abuse: Secondary | ICD-10-CM

## 2016-03-26 DIAGNOSIS — F411 Generalized anxiety disorder: Secondary | ICD-10-CM | POA: Diagnosis present

## 2016-03-26 DIAGNOSIS — Z79899 Other long term (current) drug therapy: Secondary | ICD-10-CM

## 2016-03-27 ENCOUNTER — Inpatient Hospital Stay: Payer: Medicaid Other | Admitting: Anesthesiology

## 2016-03-27 ENCOUNTER — Encounter: Payer: Self-pay | Admitting: Anesthesiology

## 2016-03-27 ENCOUNTER — Inpatient Hospital Stay
Admission: EM | Admit: 2016-03-27 | Discharge: 2016-03-29 | DRG: 767 | Disposition: A | Discharge status: Home / Self Care | Payer: Medicaid Other | Attending: Obstetrics & Gynecology | Admitting: Obstetrics & Gynecology

## 2016-03-27 DIAGNOSIS — Z3A37 37 weeks gestation of pregnancy: Secondary | ICD-10-CM | POA: Diagnosis not present

## 2016-03-27 DIAGNOSIS — Z79899 Other long term (current) drug therapy: Secondary | ICD-10-CM | POA: Diagnosis not present

## 2016-03-27 DIAGNOSIS — O99324 Drug use complicating childbirth: Secondary | ICD-10-CM | POA: Diagnosis present

## 2016-03-27 DIAGNOSIS — Z302 Encounter for sterilization: Secondary | ICD-10-CM | POA: Diagnosis not present

## 2016-03-27 DIAGNOSIS — F4310 Post-traumatic stress disorder, unspecified: Secondary | ICD-10-CM | POA: Diagnosis present

## 2016-03-27 DIAGNOSIS — O9902 Anemia complicating childbirth: Secondary | ICD-10-CM | POA: Diagnosis present

## 2016-03-27 DIAGNOSIS — D649 Anemia, unspecified: Secondary | ICD-10-CM | POA: Diagnosis present

## 2016-03-27 DIAGNOSIS — Z87891 Personal history of nicotine dependence: Secondary | ICD-10-CM | POA: Diagnosis not present

## 2016-03-27 DIAGNOSIS — O3413 Maternal care for benign tumor of corpus uteri, third trimester: Secondary | ICD-10-CM | POA: Diagnosis present

## 2016-03-27 DIAGNOSIS — Z91410 Personal history of adult physical and sexual abuse: Secondary | ICD-10-CM | POA: Diagnosis not present

## 2016-03-27 DIAGNOSIS — O289 Unspecified abnormal findings on antenatal screening of mother: Secondary | ICD-10-CM

## 2016-03-27 DIAGNOSIS — F411 Generalized anxiety disorder: Secondary | ICD-10-CM | POA: Diagnosis present

## 2016-03-27 DIAGNOSIS — Z8279 Family history of other congenital malformations, deformations and chromosomal abnormalities: Secondary | ICD-10-CM

## 2016-03-27 DIAGNOSIS — F319 Bipolar disorder, unspecified: Secondary | ICD-10-CM | POA: Diagnosis present

## 2016-03-27 DIAGNOSIS — D259 Leiomyoma of uterus, unspecified: Secondary | ICD-10-CM | POA: Diagnosis present

## 2016-03-27 DIAGNOSIS — O99344 Other mental disorders complicating childbirth: Secondary | ICD-10-CM | POA: Diagnosis present

## 2016-03-27 DIAGNOSIS — O09523 Supervision of elderly multigravida, third trimester: Secondary | ICD-10-CM | POA: Diagnosis present

## 2016-03-27 LAB — CBC
HCT: 36.1 % (ref 35.0–47.0)
HCT: 34.7 % — ABNORMAL LOW (ref 35.0–47.0)
HEMOGLOBIN: 12 g/dL (ref 12.0–16.0)
Hemoglobin: 12 g/dL (ref 12.0–16.0)
MCH: 29.4 pg (ref 26.0–34.0)
MCH: 30 pg (ref 26.0–34.0)
MCHC: 33.1 g/dL (ref 32.0–36.0)
MCHC: 34.5 g/dL (ref 32.0–36.0)
MCV: 88.8 fL (ref 80.0–100.0)
MCV: 86.8 fL (ref 80.0–100.0)
PLATELETS: 164 10*3/uL (ref 150–440)
PLATELETS: 162 10*3/uL (ref 150–440)
RBC: 4.07 MIL/uL (ref 3.80–5.20)
RBC: 4 MIL/uL (ref 3.80–5.20)
RDW: 13.7 % (ref 11.5–14.5)
RDW: 13.7 % (ref 11.5–14.5)
WBC: 11.1 10*3/uL — ABNORMAL HIGH (ref 3.6–11.0)
WBC: 10.7 10*3/uL (ref 3.6–11.0)

## 2016-03-27 LAB — TYPE AND SCREEN
ABO/RH(D): O POS
Antibody Screen: NEGATIVE

## 2016-03-27 MED ORDER — AMMONIA AROMATIC IN INHA
RESPIRATORY_TRACT | Status: AC
  Filled 2016-03-27: qty 10

## 2016-03-27 MED ORDER — OXYTOCIN BOLUS FROM INFUSION: 500.0000 mL | INTRAVENOUS | Status: DC

## 2016-03-27 MED ORDER — ACETAMINOPHEN 325 MG PO TABS
650.0000 mg | ORAL_TABLET | ORAL | Status: DC | PRN
  Filled 2016-03-27: qty 2

## 2016-03-27 MED ORDER — EPHEDRINE 5 MG/ML INJ
10.0000 mg | INTRAVENOUS | Status: DC | PRN
  Filled 2016-03-27: qty 2

## 2016-03-27 MED ORDER — FENTANYL 2.5 MCG/ML W/ROPIVACAINE 0.2% IN NS 100 ML EPIDURAL INFUSION (ARMC-ANES)
10.0000 mL/h | EPIDURAL | Status: DC
  Administered 2016-03-27: 10 mL/h via EPIDURAL

## 2016-03-27 MED ORDER — DIPHENHYDRAMINE HCL 50 MG/ML IJ SOLN
12.5000 mg | INTRAMUSCULAR | Status: DC | PRN
Start: 2016-03-27 — End: 2016-03-28

## 2016-03-27 MED ORDER — MISOPROSTOL 200 MCG PO TABS
ORAL_TABLET | ORAL | Status: AC
  Filled 2016-03-27: qty 4

## 2016-03-27 MED ORDER — LIDOCAINE-EPINEPHRINE (PF) 1.5 %-1:200000 IJ SOLN
INTRAMUSCULAR | Status: DC | PRN
  Administered 2016-03-27: 3 mL via PERINEURAL

## 2016-03-27 MED ORDER — LACTATED RINGERS IV SOLN
INTRAVENOUS | Status: DC
  Administered 2016-03-27 (×2): via INTRAVENOUS

## 2016-03-27 MED ORDER — PHENYLEPHRINE 40 MCG/ML (10ML) SYRINGE FOR IV PUSH (FOR BLOOD PRESSURE SUPPORT)
80.0000 ug | PREFILLED_SYRINGE | INTRAVENOUS | Status: DC | PRN
  Filled 2016-03-27: qty 5

## 2016-03-27 MED ORDER — LACTATED RINGERS IV SOLN
INTRAVENOUS | Status: DC
  Administered 2016-03-26 – 2016-03-27 (×2): via INTRAVENOUS

## 2016-03-27 MED ORDER — LIDOCAINE HCL (PF) 1 % IJ SOLN
INTRAMUSCULAR | Status: AC
  Administered 2016-03-27: 3 mL
  Filled 2016-03-27: qty 30

## 2016-03-27 MED ORDER — ONDANSETRON HCL 4 MG/2ML IJ SOLN: 4.0000 mg | Freq: Four times a day (QID) | INTRAMUSCULAR | Status: DC | PRN

## 2016-03-27 MED ORDER — BUPIVACAINE HCL (PF) 0.25 % IJ SOLN
INTRAMUSCULAR | Status: DC | PRN
  Administered 2016-03-27: 10 mL via EPIDURAL

## 2016-03-27 MED ORDER — FENTANYL 2.5 MCG/ML W/ROPIVACAINE 0.2% IN NS 100 ML EPIDURAL INFUSION (ARMC-ANES)
EPIDURAL | Status: AC
  Administered 2016-03-27: 10 mL/h via EPIDURAL
  Filled 2016-03-27: qty 100

## 2016-03-27 MED ORDER — SODIUM CHLORIDE 0.9 % IV SOLN
INTRAVENOUS | Status: AC
  Filled 2016-03-27: qty 2000

## 2016-03-27 MED ORDER — LACTATED RINGERS IV SOLN: 500.0000 mL | INTRAVENOUS | Status: DC | PRN

## 2016-03-27 MED ORDER — OXYTOCIN 40 UNITS IN LACTATED RINGERS INFUSION - SIMPLE MED
1.0000 m[IU]/min | INTRAVENOUS | Status: DC
  Administered 2016-03-27: 2 m[IU]/min via INTRAVENOUS
  Filled 2016-03-27: qty 1000

## 2016-03-27 MED ORDER — OXYTOCIN 10 UNIT/ML IJ SOLN
INTRAMUSCULAR | Status: AC
  Filled 2016-03-27: qty 2

## 2016-03-27 MED ORDER — LIDOCAINE HCL (PF) 1 % IJ SOLN: 30.0000 mL | INTRAMUSCULAR | Status: DC | PRN

## 2016-03-27 MED ORDER — VARICELLA VIRUS VACCINE LIVE 1350 PFU/0.5ML IJ SUSR
0.5000 mL | INTRAMUSCULAR | Status: AC | PRN
  Administered 2016-03-29: 0.5 mL via SUBCUTANEOUS
  Filled 2016-03-27: qty 0.5

## 2016-03-27 MED ORDER — SOD CITRATE-CITRIC ACID 500-334 MG/5ML PO SOLN: 30.0000 mL | ORAL | Status: DC | PRN

## 2016-03-27 MED ORDER — LIDOCAINE HCL (PF) 2 % IJ SOLN
INTRAMUSCULAR | Status: DC | PRN
  Administered 2016-03-27: 7 mL via INTRADERMAL

## 2016-03-27 MED ORDER — OXYTOCIN 40 UNITS IN LACTATED RINGERS INFUSION - SIMPLE MED
2.5000 [IU]/h | INTRAVENOUS | Status: DC
  Administered 2016-03-27: 39.96 [IU]/h via INTRAVENOUS
  Filled 2016-03-27: qty 1000

## 2016-03-27 MED ORDER — LACTATED RINGERS IV SOLN: 500.0000 mL | Freq: Once | INTRAVENOUS | Status: DC
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
## 2016-03-27 NOTE — Discharge Summary (Signed)
Britanee Gobrecht is a 35 y.o. female. She is at [redacted]w[redacted]d gestation.  Chief Complaint: contractions  S: Resting comfortably. + CTX, no VB, no LOF,  Active fetal movement.   States that uterine contractions started several hours ago and are worsening, becoming more frequent, now about q35min apart.  She rates them as 8/10 in intensity.  Nothing makes them better or worse.    Maternal Medical History:   Past Medical History  Diagnosis Date  . PTSD (post-traumatic stress disorder)   . Irregular heart beat   . Bipolar disorder with current episode depressed ([place])   . Generalized anxiety disorder   . Advanced maternal age in multigravida   . Marijuana abuse     Past Surgical History  Procedure Laterality Date  . No past surgeries      No Known Allergies  Prior to Admission medications   Medication Sig Start Date End Date Taking? Authorizing Provider  ferrous sulfate 325 (65 FE) MG tablet Take 325 mg by mouth 2 (two) times daily with a meal.   Yes Historical Provider, MD  FLUoxetine (PROZAC) 20 MG tablet Take 60 mg by mouth daily. Reported on 12/21/2015   Yes Historical Provider, MD  Prenatal Vit-Fe Fumarate-FA (PRENATAL MULTIVITAMIN) TABS tablet Take 1 tablet by mouth daily at 12 noon.   Yes Historical Provider, MD  acetaminophen (TYLENOL) 325 MG tablet Take 650 mg by mouth every 6 (six) hours as needed.    Historical Provider, MD     Prenatal care site: [facility]   Social History: She  reports that she has quit smoking. Her smoking use included Cigarettes. She smoked 0.25 packs per day. She has never used smokeless tobacco. She reports that she does not drink alcohol or use illicit drugs.  Family History: family history includes Bipolar disorder in her mother; Diabetes in her father; Hypertension in her father and mother; Thyroid disease in her mother.   Review of Systems: A full review of systems was performed and negative except as noted in the HPI.     O:  BP  111/65 mmHg  Pulse 80  Temp(Src) 97.8 F (36.6 C) (Oral)  Resp 16  Ht 5\' 4"  (1.626 m)  Wt 71.215 kg (157 lb)  BMI 26.94 kg/m2  LMP 07/12/2015 Results for orders placed or performed during the hospital encounter of 03/26/16 (from the past 48 hour(s))  CBC   Collection Time: 03/26/16 11:15 PM  Result Value Ref Range   WBC 11.1 (H) 3.6 - 11.0 K/uL   RBC 4.07 3.80 - 5.20 MIL/uL   Hemoglobin 12.0 12.0 - 16.0 g/dL   HCT 36.1 35.0 - 47.0 %   MCV 88.8 80.0 - 100.0 fL   MCH 29.4 26.0 - 34.0 pg   MCHC 33.1 32.0 - 36.0 g/dL   RDW 13.7 11.5 - 14.5 %   Platelets 164 150 - 440 K/uL     Constitutional: NAD, AAOx3  HE/ENT: extraocular movements grossly intact, moist mucous membranes CV: RRR PULM: nl respiratory effort, CTABL     Abd: gravid, non-tender, non-distended, soft      Ext: Non-tender, Nonedmeatous   Psych: mood appropriate, speech normal Pelvic: 2.5/40/-3/posterior/soft  FHT: 140 mod +accels no decels TOCO: q77mins apart    A/P:  35yo ID:[id number] @ 37.1 with rule out labor.   Labor: not present.   Initially patient was examined by RN and said to be 4cm.  Same RN then rechecked her and found her to only be 2cm.  She remained 2cm for several hours and contractions spaced out.  Fetal Wellbeing: Reassuring Cat 1 tracing.  D/c home stable, precautions reviewed, follow-up as scheduled.   ----- Larey Days, MD Attending Obstetrician and Gynecologist [facility]

## 2016-03-27 NOTE — Discharge Instructions (Signed)
Braxton Hicks Contractions °Contractions of the uterus can occur throughout pregnancy. Contractions are not always a sign that you are in labor.  °WHAT ARE BRAXTON HICKS CONTRACTIONS?  °Contractions that occur before labor are called Braxton Hicks contractions, or false labor. Toward the end of pregnancy (32-34 weeks), these contractions can develop more often and may become more forceful. This is not true labor because these contractions do not result in opening (dilatation) and thinning of the cervix. They are sometimes difficult to tell apart from true labor because these contractions can be forceful and people have different pain tolerances. You should not feel embarrassed if you go to the hospital with false labor. Sometimes, the only way to tell if you are in true labor is for your health care provider to look for changes in the cervix. °If there are no prenatal problems or other health problems associated with the pregnancy, it is completely safe to be sent home with false labor and await the onset of true labor. °HOW CAN YOU TELL THE DIFFERENCE BETWEEN TRUE AND FALSE LABOR? °False Labor °· The contractions of false labor are usually shorter and not as hard as those of true labor.   °· The contractions are usually irregular.   °· The contractions are often felt in the front of the lower abdomen and in the groin.   °· The contractions may go away when you walk around or change positions while lying down.   °· The contractions get weaker and are shorter lasting as time goes on.   °· The contractions do not usually become progressively stronger, regular, and closer together as with true labor.   °True Labor °· Contractions in true labor last 30-70 seconds, become very regular, usually become more intense, and increase in frequency.   °· The contractions do not go away with walking.   °· The discomfort is usually felt in the top of the uterus and spreads to the lower abdomen and low back.   °· True labor can be  determined by your health care provider with an exam. This will show that the cervix is dilating and getting thinner.   °WHAT TO REMEMBER °· Keep up with your usual exercises and follow other instructions given by your health care provider.   °· Take medicines as directed by your health care provider.   °· Keep your regular prenatal appointments.   °· Eat and drink lightly if you think you are going into labor.   °· If Braxton Hicks contractions are making you uncomfortable:   °¨ Change your position from lying down or resting to walking, or from walking to resting.   °¨ Sit and rest in a tub of warm water.   °¨ Drink 2-3 glasses of water. Dehydration may cause these contractions.   °¨ Do slow and deep breathing several times an hour.   °WHEN SHOULD I SEEK IMMEDIATE MEDICAL CARE? °Seek immediate medical care if: °· Your contractions become stronger, more regular, and closer together.   °· You have fluid leaking or gushing from your vagina.   °· You have a fever.   °· You pass blood-tinged mucus.   °· You have vaginal bleeding.   °· You have continuous abdominal pain.   °· You have low back pain that you never had before.   °· You feel your baby's head pushing down and causing pelvic pressure.   °· Your baby is not moving as much as it used to.   °  °This information is not intended to replace advice given to you by your health care provider. Make sure you discuss any questions you have with your health care   provider. °  °Document Released: 09/05/2005 Document Revised: 09/10/2013 Document Reviewed: 06/17/2013 °Elsevier Interactive Patient Education ©2016 Elsevier Inc. ° °

## 2016-03-27 NOTE — Anesthesia Preprocedure Evaluation (Signed)
Anesthesia Evaluation  Patient identified by MRN, date of birth, ID band Patient awake    Reviewed: Allergy & Precautions, NPO status , Patient's Chart, lab work & pertinent test results  Airway Mallampati: II  TM Distance: >3 FB     Dental no notable dental hx.    Pulmonary former smoker,    Pulmonary exam normal        Cardiovascular negative cardio ROS Normal cardiovascular exam     Neuro/Psych PSYCHIATRIC DISORDERS Depression Bipolar Disorder PTSDnegative neurological ROS     GI/Hepatic negative GI ROS, Neg liver ROS,   Endo/Other  negative endocrine ROS  Renal/GU negative Renal ROS  negative genitourinary   Musculoskeletal negative musculoskeletal ROS (+)   Abdominal Normal abdominal exam  (+)   Peds negative pediatric ROS (+)  Hematology negative hematology ROS (+)   Anesthesia Other Findings   Reproductive/Obstetrics (+) Pregnancy                             Anesthesia Physical Anesthesia Plan  ASA: II  Anesthesia Plan: Epidural   Post-op Pain Management:    Induction:   Airway Management Planned: Natural Airway  Additional Equipment:   Intra-op Plan:   Post-operative Plan:   Informed Consent:   Dental advisory given  Plan Discussed with: CRNA and Surgeon  Anesthesia Plan Comments:         Anesthesia Quick Evaluation
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
## 2016-03-27 NOTE — Anesthesia Procedure Notes (Signed)
Epidural Patient location during procedure: OB Start time: 03/27/2016 6:12 PM End time: 03/27/2016 6:22 PM  Staffing Anesthesiologist: Alvin Critchley Performed by: anesthesiologist   Preanesthetic Checklist Completed: patient identified, site marked, surgical consent, pre-op evaluation, timeout performed, IV checked, risks and benefits discussed and monitors and equipment checked  Epidural Patient position: sitting Prep: Betadine and site prepped and draped Patient monitoring: heart rate, cardiac monitor, continuous pulse ox and blood pressure Approach: midline Location: L3-L4 Injection technique: LOR air  Needle:  Needle type: Tuohy  Needle gauge: 18 G Needle length: 9 cm Needle insertion depth: 5 cm Catheter type: closed end Catheter size: 20 Guage Test dose: negative and 1.5% lidocaine with Epi 1:200 K  Assessment Sensory level: T8  Additional Notes Time out called.  Patient placed in sitting position.  Back prepped and draped in sterile fashion.  A skin wheal was made in the L3-L4 interspace with 1% Lidocaine plain.  An 37 G Tuohy needle was guided into the epidural space by a loss of resistance technique.. The epidural catheter was guided 3 cm with a negative TD.Marland Kitchen No blood or paresthesias.  The catheter was affixed to the back in a sterile fashion.  The patient tolerated the procedure wellReason for block:procedure for pain

## 2016-03-28 ENCOUNTER — Encounter
Admission: EM | Disposition: A | Discharge status: Home / Self Care | Payer: Self-pay | Source: Home / Self Care | Attending: Obstetrics & Gynecology

## 2016-03-28 ENCOUNTER — Inpatient Hospital Stay: Payer: Medicaid Other | Admitting: Certified Registered Nurse Anesthetist

## 2016-03-28 HISTORY — PX: TUBAL LIGATION: SHX77

## 2016-03-28 LAB — CBC
HEMATOCRIT: 33.7 % — AB (ref 35.0–47.0)
Hemoglobin: 11.5 g/dL — ABNORMAL LOW (ref 12.0–16.0)
MCH: 29.8 pg (ref 26.0–34.0)
MCHC: 34 g/dL (ref 32.0–36.0)
MCV: 87.7 fL (ref 80.0–100.0)
PLATELETS: 142 10*3/uL — AB (ref 150–440)
RBC: 3.85 MIL/uL (ref 3.80–5.20)
RDW: 13.8 % (ref 11.5–14.5)
WBC: 12 10*3/uL — ABNORMAL HIGH (ref 3.6–11.0)

## 2016-03-28 LAB — BASIC METABOLIC PANEL
Anion gap: 5 (ref 5–15)
BUN: 7 mg/dL (ref 6–20)
CALCIUM: 8.5 mg/dL — AB (ref 8.9–10.3)
CO2: 22 mmol/L (ref 22–32)
CREATININE: 0.39 mg/dL — AB (ref 0.44–1.00)
Chloride: 108 mmol/L (ref 101–111)
GLUCOSE: 95 mg/dL (ref 65–99)
Potassium: 4 mmol/L (ref 3.5–5.1)
Sodium: 135 mmol/L (ref 135–145)

## 2016-03-28 SURGERY — LIGATION, FALLOPIAN TUBE, POSTPARTUM
Anesthesia: General | Laterality: Bilateral | Wound class: Clean

## 2016-03-28 SURGERY — LIGATION, FALLOPIAN TUBE, POSTPARTUM
Anesthesia: General | Laterality: Bilateral

## 2016-03-28 MED ORDER — DIPHENHYDRAMINE HCL 25 MG PO CAPS: 25.0000 mg | ORAL_CAPSULE | Freq: Four times a day (QID) | ORAL | Status: DC | PRN

## 2016-03-28 MED ORDER — FENTANYL CITRATE (PF) 100 MCG/2ML IJ SOLN
INTRAMUSCULAR | Status: AC
  Administered 2016-03-28: 25 ug via INTRAVENOUS
  Filled 2016-03-28: qty 2

## 2016-03-28 MED ORDER — ACETAMINOPHEN 10 MG/ML IV SOLN
INTRAVENOUS | Status: DC | PRN
  Administered 2016-03-28: 1000 mg via INTRAVENOUS

## 2016-03-28 MED ORDER — ACETAMINOPHEN 10 MG/ML IV SOLN
INTRAVENOUS | Status: AC
  Filled 2016-03-28: qty 100

## 2016-03-28 MED ORDER — TETANUS-DIPHTH-ACELL PERTUSSIS 5-2.5-18.5 LF-MCG/0.5 IM SUSP: 0.5000 mL | Freq: Once | INTRAMUSCULAR | Status: DC

## 2016-03-28 MED ORDER — WITCH HAZEL-GLYCERIN EX PADS: 1.0000 "application " | MEDICATED_PAD | CUTANEOUS | Status: DC | PRN

## 2016-03-28 MED ORDER — ONDANSETRON HCL 4 MG/2ML IJ SOLN: 4.0000 mg | Freq: Once | INTRAMUSCULAR | Status: DC | PRN

## 2016-03-28 MED ORDER — ONDANSETRON HCL 4 MG/2ML IJ SOLN: 4.0000 mg | INTRAMUSCULAR | Status: DC | PRN

## 2016-03-28 MED ORDER — OXYCODONE HCL 5 MG PO TABS
5.0000 mg | ORAL_TABLET | ORAL | Status: DC | PRN
  Administered 2016-03-28 – 2016-03-29 (×7): 5 mg via ORAL
  Filled 2016-03-28 (×7): qty 1

## 2016-03-28 MED ORDER — DIBUCAINE 1 % RE OINT
1.0000 "application " | TOPICAL_OINTMENT | RECTAL | Status: DC | PRN
  Filled 2016-03-28: qty 28

## 2016-03-28 MED ORDER — FENTANYL CITRATE (PF) 100 MCG/2ML IJ SOLN
INTRAMUSCULAR | Status: DC | PRN
  Administered 2016-03-28 (×2): 50 ug via INTRAVENOUS

## 2016-03-28 MED ORDER — PROPOFOL 10 MG/ML IV BOLUS
INTRAVENOUS | Status: DC | PRN
  Administered 2016-03-28: 150 mg via INTRAVENOUS

## 2016-03-28 MED ORDER — KETOROLAC TROMETHAMINE 30 MG/ML IJ SOLN
INTRAMUSCULAR | Status: DC | PRN
  Administered 2016-03-28: 30 mg via INTRAVENOUS

## 2016-03-28 MED ORDER — MIDAZOLAM HCL 2 MG/2ML IJ SOLN
INTRAMUSCULAR | Status: DC | PRN
  Administered 2016-03-28: 2 mg via INTRAVENOUS

## 2016-03-28 MED ORDER — SUGAMMADEX SODIUM 200 MG/2ML IV SOLN
INTRAVENOUS | Status: DC | PRN
  Administered 2016-03-28: 145.2 mg via INTRAVENOUS

## 2016-03-28 MED ORDER — PRENATAL MULTIVITAMIN CH: 1.0000 | ORAL_TABLET | Freq: Every day | ORAL | Status: DC

## 2016-03-28 MED ORDER — COCONUT OIL OIL: 1.0000 "application " | TOPICAL_OIL | Status: DC | PRN

## 2016-03-28 MED ORDER — CEFAZOLIN SODIUM-DEXTROSE 2-4 GM/100ML-% IV SOLN
2.0000 g | INTRAVENOUS | Status: AC
  Administered 2016-03-28: 2 g via INTRAVENOUS

## 2016-03-28 MED ORDER — BENZOCAINE-MENTHOL 20-0.5 % EX AERO: 1.0000 "application " | INHALATION_SPRAY | CUTANEOUS | Status: DC | PRN

## 2016-03-28 MED ORDER — DOCUSATE SODIUM 100 MG PO CAPS
100.0000 mg | ORAL_CAPSULE | Freq: Two times a day (BID) | ORAL | Status: DC
Start: 2016-03-28 — End: 2016-03-29
  Administered 2016-03-28 – 2016-03-29 (×2): 100 mg via ORAL
  Filled 2016-03-28 (×2): qty 1

## 2016-03-28 MED ORDER — LIDOCAINE HCL (CARDIAC) 20 MG/ML IV SOLN
INTRAVENOUS | Status: DC | PRN
  Administered 2016-03-28: 80 mg via INTRAVENOUS

## 2016-03-28 MED ORDER — BUPIVACAINE HCL 0.5 % IJ SOLN
INTRAMUSCULAR | Status: DC | PRN
Start: 2016-03-28 — End: 2016-03-28
  Administered 2016-03-28: 10 mL

## 2016-03-28 MED ORDER — LACTATED RINGERS IV SOLN
INTRAVENOUS | Status: DC | PRN
  Administered 2016-03-28: 12:00:00 via INTRAVENOUS

## 2016-03-28 MED ORDER — ACETAMINOPHEN 325 MG PO TABS
650.0000 mg | ORAL_TABLET | ORAL | Status: DC | PRN
  Administered 2016-03-28 – 2016-03-29 (×4): 650 mg via ORAL
  Filled 2016-03-28 (×4): qty 2

## 2016-03-28 MED ORDER — ROCURONIUM BROMIDE 100 MG/10ML IV SOLN
INTRAVENOUS | Status: DC | PRN
  Administered 2016-03-28: 10 mg via INTRAVENOUS
  Administered 2016-03-28: 30 mg via INTRAVENOUS

## 2016-03-28 MED ORDER — CEFAZOLIN SODIUM-DEXTROSE 2-4 GM/100ML-% IV SOLN: 2.0000 g | INTRAVENOUS | Status: DC

## 2016-03-28 MED ORDER — FENTANYL CITRATE (PF) 100 MCG/2ML IJ SOLN
25.0000 ug | INTRAMUSCULAR | Status: AC | PRN
  Administered 2016-03-28 (×6): 25 ug via INTRAVENOUS

## 2016-03-28 MED ORDER — SIMETHICONE 80 MG PO CHEW
80.0000 mg | CHEWABLE_TABLET | ORAL | Status: DC | PRN
  Administered 2016-03-29: 80 mg via ORAL
  Filled 2016-03-28: qty 1

## 2016-03-28 MED ORDER — LACTATED RINGERS IV SOLN: INTRAVENOUS | Status: DC

## 2016-03-28 MED ORDER — DEXAMETHASONE SODIUM PHOSPHATE 10 MG/ML IJ SOLN
INTRAMUSCULAR | Status: DC | PRN
  Administered 2016-03-28: 8 mg via INTRAVENOUS

## 2016-03-28 MED ORDER — ONDANSETRON HCL 4 MG/2ML IJ SOLN
INTRAMUSCULAR | Status: DC | PRN
  Administered 2016-03-28: 4 mg via INTRAVENOUS

## 2016-03-28 MED ORDER — ONDANSETRON HCL 4 MG PO TABS: 4.0000 mg | ORAL_TABLET | ORAL | Status: DC | PRN

## 2016-03-28 SURGICAL SUPPLY — 26 items
CHLORAPREP W/TINT 26ML (MISCELLANEOUS) ×2 IMPLANT
DRAPE LAPAROTOMY 100X77 ABD (DRAPES) ×2 IMPLANT
DRESSING TELFA 4X3 1S ST N-ADH (GAUZE/BANDAGES/DRESSINGS) ×2 IMPLANT
DRSG TEGADERM 2-3/8X2-3/4 SM (GAUZE/BANDAGES/DRESSINGS) ×2 IMPLANT
ELECT REM PT RETURN 9FT ADLT (ELECTROSURGICAL) ×2
ELECTRODE REM PT RTRN 9FT ADLT (ELECTROSURGICAL) ×1 IMPLANT
GAUZE SPONGE 4X4 12PLY STRL (GAUZE/BANDAGES/DRESSINGS) ×2 IMPLANT
GLOVE BIO SURGEON STRL SZ7 (GLOVE) ×2 IMPLANT
GLOVE INDICATOR 7.5 STRL GRN (GLOVE) ×2 IMPLANT
GOWN STRL REUS W/ TWL LRG LVL3 (GOWN DISPOSABLE) ×2 IMPLANT
GOWN STRL REUS W/TWL LRG LVL3 (GOWN DISPOSABLE) ×2
LABEL OR SOLS (LABEL) ×2 IMPLANT
LIQUID BAND (GAUZE/BANDAGES/DRESSINGS) ×2 IMPLANT
NDL SAFETY 22GX1.5 (NEEDLE) ×2 IMPLANT
NS IRRIG 500ML POUR BTL (IV SOLUTION) ×2 IMPLANT
PACK BASIN MINOR ARMC (MISCELLANEOUS) ×2 IMPLANT
SPONGE LAP 4X18 5PK (MISCELLANEOUS) ×4 IMPLANT
STRAP SAFETY BODY (MISCELLANEOUS) ×2 IMPLANT
SUT CHROMIC GUT BROWN 0 54 (SUTURE) ×1 IMPLANT
SUT CHROMIC GUT BROWN 0 54IN (SUTURE) ×2
SUT MNCRL 4-0 (SUTURE) ×1
SUT MNCRL 4-0 27XMFL (SUTURE) ×1
SUT VIC AB 0 UR5 27 (SUTURE) ×2 IMPLANT
SUT VIC AB 2-0 UR6 27 (SUTURE) ×4 IMPLANT
SUTURE MNCRL 4-0 27XMF (SUTURE) ×1 IMPLANT
SYRINGE 10CC LL (SYRINGE) ×2 IMPLANT

## 2016-03-28 NOTE — Progress Notes (Signed)
Post Partum Day 1 Subjective: Doing well, no complaints.  Tolerating regular diet, pain with PO meds, voiding and ambulating without difficulty. Pt is NPO this morning awaiting her tubal ligation procedure scheduled for noon.  No CP SOB F/C N/V or leg pain No HA, change of vision, RUQ/epigastric pain  Objective: BP 108/71 mmHg  Pulse 73  Temp(Src) 98 F (36.7 C) (Oral)  Resp 18  Ht 5\' 4"  (1.626 m)  Wt 71.215 kg (157 lb)  BMI 26.94 kg/m2  SpO2 100%  LMP 07/12/2015  Breastfeeding/bottle feeding  Physical Exam:  General: NAD CV: RRR Pulm: nl effort, CTABL Lochia: moderate Uterine Fundus: fundus firm and below umbilicus DVT Evaluation: no cords, ttp LEs    Recent Labs  03/27/16 1726 03/28/16 0400  HGB 12.0 11.5*  HCT 34.7* 33.7*  WBC 10.7 12.0*  PLT 162 142*    Assessment/Plan: 35 y.o. [id number] postpartum day # 1  1. NPO for bilateral tubal ligation 2. Continue routine postpartum care following tubal 3. Breast/bottle feeding 4. O+, RI, V Not Immune, TDAP UTD 5. Disposition: home tomorrow   Rod Can, [place]

## 2016-03-28 NOTE — Lactation Note (Signed)
This note was copied from a baby's chart. Lactation Consultation Note  Patient Name: Pamela Mcintyre Today's Date: 03/28/2016 Reason for consult: Initial assessment   Maternal Data Formula Feeding for Exclusion: No Does the patient have breastfeeding experience prior to this delivery?: No Encouraged mother to breastfeed each feeding, formula supplement not needed, if mom concerned, supplement only after breastfeeding first, pt did not breastfeed her other children.  Feeding Feeding Type: Breast Milk Length of feed: 20 min  LATCH Score/Interventions Latch: Grasps breast easily, tongue down, lips flanged, rhythmical sucking. Intervention(s): Assist with latch;Breast compression  Audible Swallowing: A few with stimulation Intervention(s): Skin to skin  Type of Nipple: Everted at rest and after stimulation  Comfort (Breast/Nipple): Soft / non-tender     Hold (Positioning): Assistance needed to correctly position infant at breast and maintain latch.  LATCH Score: 8  Lactation Tools Discussed/Used     Consult Status Consult Status: PRN    Ferol Luz 03/28/2016, 6:55 PM

## 2016-03-28 NOTE — Anesthesia Preprocedure Evaluation (Signed)
Anesthesia Evaluation  Patient identified by MRN, date of birth, ID band Patient awake    Reviewed: Allergy & Precautions, NPO status   Airway Mallampati: I       Dental  (+) Teeth Intact   Pulmonary neg pulmonary ROS, former smoker,    breath sounds clear to auscultation       Cardiovascular negative cardio ROS   Rhythm:Regular     Neuro/Psych negative neurological ROS     GI/Hepatic negative GI ROS, Neg liver ROS,   Endo/Other  negative endocrine ROS  Renal/GU negative Renal ROS     Musculoskeletal negative musculoskeletal ROS (+)   Abdominal Normal abdominal exam  (+)   Peds negative pediatric ROS (+)  Hematology negative hematology ROS (+)   Anesthesia Other Findings   Reproductive/Obstetrics negative OB ROS                             Anesthesia Physical Anesthesia Plan  ASA: II  Anesthesia Plan: General   Post-op Pain Management:    Induction: Intravenous  Airway Management Planned: Oral ETT  Additional Equipment:   Intra-op Plan:   Post-operative Plan: Extubation in OR  Informed Consent: I have reviewed the patients History and Physical, chart, labs and discussed the procedure including the risks, benefits and alternatives for the proposed anesthesia with the patient or authorized representative who has indicated his/her understanding and acceptance.     Plan Discussed with: CRNA  Anesthesia Plan Comments:         Anesthesia Quick Evaluation

## 2016-03-28 NOTE — H&P (Signed)
Date of Initial H&P: 03/27/16  History reviewed, patient examined, no change in status, stable for surgery.

## 2016-03-28 NOTE — Op Note (Signed)
Preoperative Diagnosis: 1) 35 y.o.  [id number] desiring permenant surgical sterilization  Postoperative Diagnosis: 1) 35 y.o. [id number] desiring permenant surgical sterilization  Operation Performed: Postpartum bilateral tubal ligation via Palmeroy method  Indication: 35 y.o. [id number]  with undesired fertility, desires permanent sterilization.  Other reversible forms of contraception were discussed with patient; she declines all other modalities. Permanent nature of as well as associated risks of the procedure discussed with patient including but not limited to: risk of regret, permanence of method, bleeding, infection, injury to surrounding organs and need for additional procedures.  Failure risk of 0.5-1% with increased risk of ectopic gestation if pregnancy occurs was also discussed with patient.    Anesthesia: General  Preoperative Antibiotics: none  Estimated Blood Loss:minimal  IV Fluids: 685mL  Drains or Tubes: none  Implants: none  Specimens Removed: portion of right and left fallopian tube  Complications: none  Intraoperative Findings: Normal tubes, and tubes.  Mild enlarged fibroid uterus.    Patient Condition: stable  Procedure in Detail:  Patient was taken to the operating room where she was administered general anesthesia.  She was positioned supine, prepped and draped in the usual sterile fashion.  Prior to proceeding with procedure a time out was performed. Attention was turned to the patient's abdomen.  The umbilicus was infiltrated with 21mL of 1% Sensorcaine, before making a stab incision using an 11 blade scalpel and extended for 3cm.  The underlying subcutaneous tissue was cleared off the rectus fascia using a hemostat.  The fascia was tented up with two hemostats and incised using Mayo scissors.  A moist minilap was used to pack away the omentum, the table was tilted to the left and the  right tube was identified, walked out to its fimbriated end before placing two  ligations using 2-0 chromic on a wheel.  The intervening knuckle of tube was excised, ostia were visualized and the tubal segment noted to be hemostatic.  The minilap was removed the patient table tilted to the right and the same procedure was repeated for the patient left tube. The fascia was closed using a running 0 Vicryl, the skin was closed using 4-0 Monocryl.  Surgical skin glue.   Sponge needle and instrument counts were correct time two.  The patient tolerated the procedure well and was taken to the recovery room in stable condition.

## 2016-03-28 NOTE — Discharge Instructions (Signed)

## 2016-03-28 NOTE — Anesthesia Procedure Notes (Signed)
Procedure Name: Intubation Performed by: Demetrius Charity Pre-anesthesia Checklist: Patient identified, Patient being monitored, Timeout performed, Emergency Drugs available and Suction available Patient Re-evaluated:Patient Re-evaluated prior to inductionOxygen Delivery Method: Circle system utilized Preoxygenation: Pre-oxygenation with 100% oxygen Intubation Type: IV induction Ventilation: Mask ventilation without difficulty Laryngoscope Size: Mac and 3 Grade View: Grade III Tube type: Oral Tube size: 7.0 mm Number of attempts: 1 Airway Equipment and Method: Stylet Placement Confirmation: ETT inserted through vocal cords under direct vision,  positive ETCO2 and breath sounds checked- equal and bilateral Secured at: 21 cm Tube secured with: Tape Dental Injury: Teeth and Oropharynx as per pre-operative assessment

## 2016-03-28 NOTE — Plan of Care (Signed)
Problem: Nutritional: Goal: Dietary intake will improve Outcome: Not Progressing NPO for Surgery in AM

## 2016-03-28 NOTE — Transfer of Care (Signed)
Immediate Anesthesia Transfer of Care Note  Patient: Pamela Mcintyre  Procedure(s) Performed: Procedure(s): POST PARTUM TUBAL LIGATION (Bilateral)  Patient Location: PACU  Anesthesia Type:General  Level of Consciousness: awake, alert  and oriented  Airway & Oxygen Therapy: Patient Spontanous Breathing and Patient connected to face mask oxygen  Post-op Assessment: Report given to RN and Post -op Vital signs reviewed and stable  Post vital signs: Reviewed and stable  Last Vitals:  Filed Vitals:   03/28/16 0720 03/28/16 1105  BP: 108/71 94/83  Pulse: 73 75  Temp: 36.7 C 36.1 C  Resp: 18 16    Last Pain:  Filed Vitals:   03/28/16 1106  PainSc: 3          Complications: No apparent anesthesia complications

## 2016-03-28 NOTE — Anesthesia Postprocedure Evaluation (Signed)
Anesthesia Post Note  Patient: Pamela Mcintyre  Procedure(s) Performed: * No procedures listed *  Patient location during evaluation: Mother Baby Anesthesia Type: Epidural Level of consciousness: awake, awake and alert and oriented Pain management: pain level controlled Vital Signs Assessment: post-procedure vital signs reviewed and stable Respiratory status: spontaneous breathing, nonlabored ventilation and respiratory function stable Cardiovascular status: blood pressure returned to baseline and stable Postop Assessment: no headache, no backache and no signs of nausea or vomiting Anesthetic complications: no    Last Vitals:  Filed Vitals:   03/28/16 0259 03/28/16 0720  BP: 109/71 108/71  Pulse: 79 73  Temp: 36.8 C 36.7 C  Resp: 20 18    Last Pain:  Filed Vitals:   03/28/16 0720  PainSc: Asleep                 Johnna Acosta

## 2016-03-28 NOTE — Anesthesia Postprocedure Evaluation (Signed)
Anesthesia Post Note  Patient: Pamela Mcintyre  Procedure(s) Performed: Procedure(s) (LRB): POST PARTUM TUBAL LIGATION (Bilateral)  Patient location during evaluation: PACU Anesthesia Type: General Level of consciousness: awake Pain management: satisfactory to patient Vital Signs Assessment: post-procedure vital signs reviewed and stable Respiratory status: nonlabored ventilation Cardiovascular status: stable Anesthetic complications: no    Last Vitals:  Filed Vitals:   03/28/16 1403 03/28/16 1423  BP: 125/78 116/66  Pulse: 79 76  Temp: 36.7 C 36.8 C  Resp: 11 16    Last Pain:  Filed Vitals:   03/28/16 1425  PainSc: 2                  VAN STAVEREN,[place] Pet[place]

## 2016-03-29 ENCOUNTER — Encounter: Payer: Self-pay | Admitting: Obstetrics and Gynecology

## 2016-03-29 LAB — RPR: RPR: NONREACTIVE

## 2016-03-29 LAB — SURGICAL PATHOLOGY

## 2016-03-29 MED ORDER — OXYCODONE HCL 5 MG PO TABS: 5.0000 mg | ORAL_TABLET | ORAL | Status: AC | PRN

## 2016-03-29 NOTE — Progress Notes (Signed)
Discharge instructions reviewed with patient.  All questions answered.  Pt to schedule follow up appointment. Patient discharge to home via wheelchair with spouse and baby in car seat.

## 2017-01-05 IMAGING — US US OB LIMITED
1 series · 14 of 23 positions shown · non-contrast
Comparison: none

CLINICAL DATA: Vaginal bleeding, 12 weeks pregnant

EXAM:
LIMITED OBSTETRIC ULTRASOUND

[Series 1: us ob limited · 0.19mm/px · 14 of 23 slices shown]
[im 1/23]
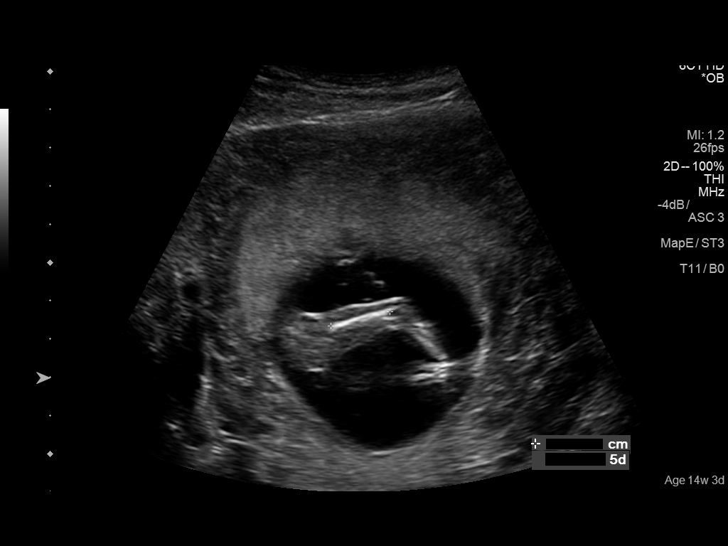
[im 3/23]
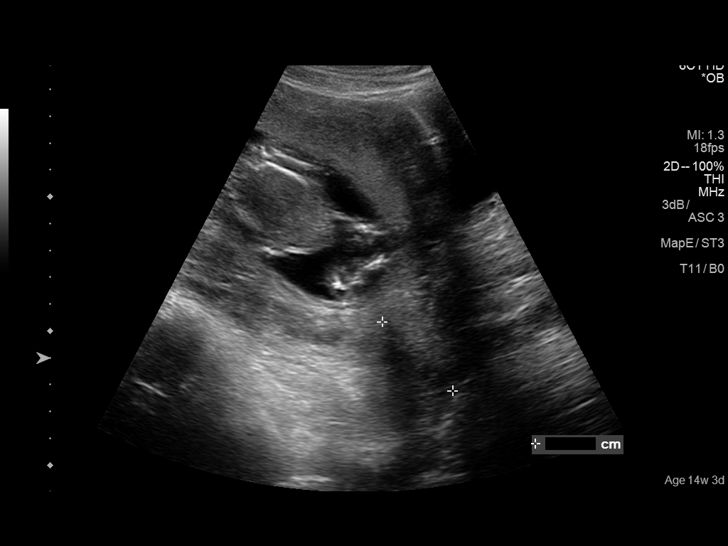
[im 5/23]
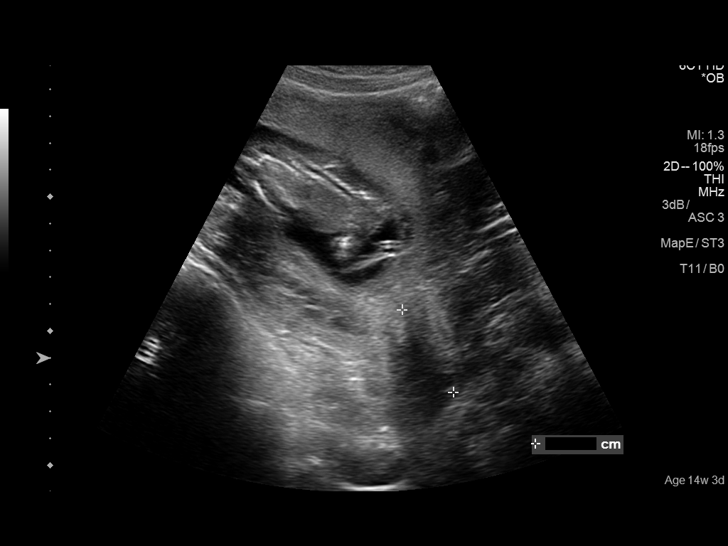
[im 6/23]
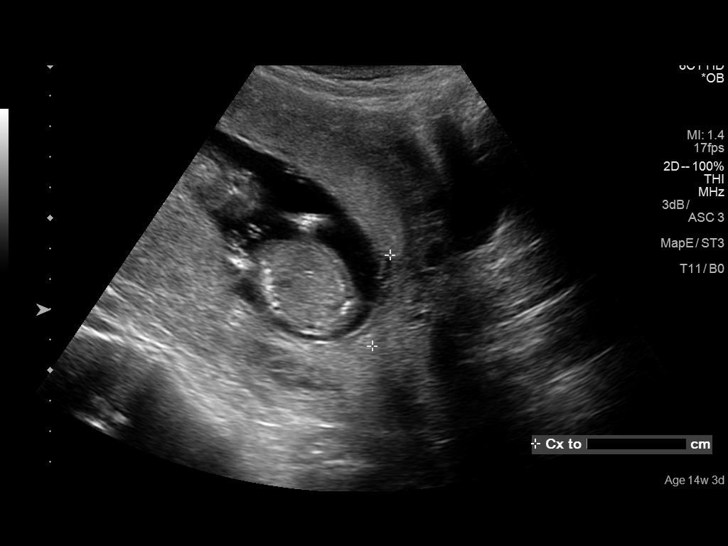
[im 8/23]
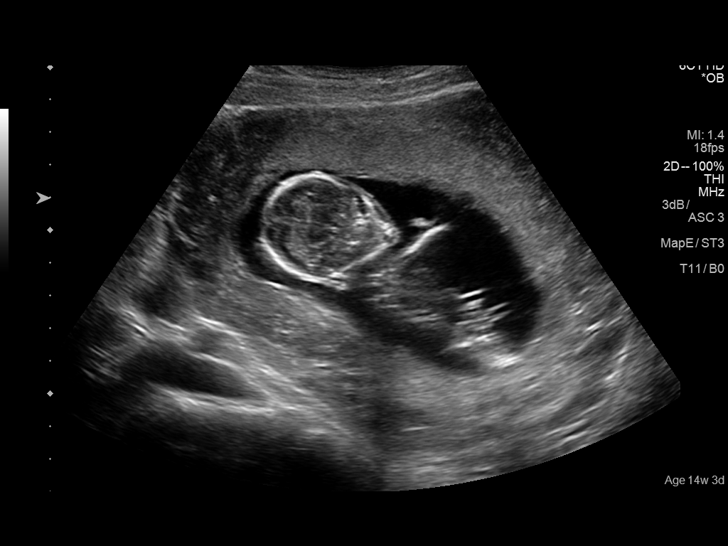
[im 10/23]
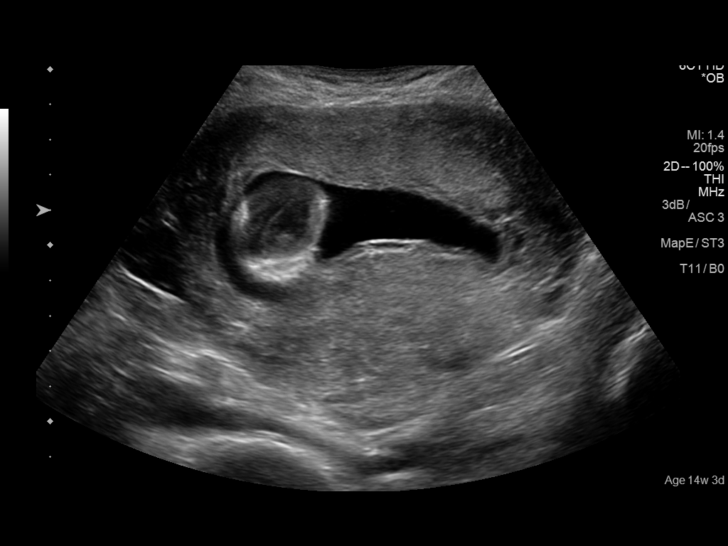
[im 11/23]
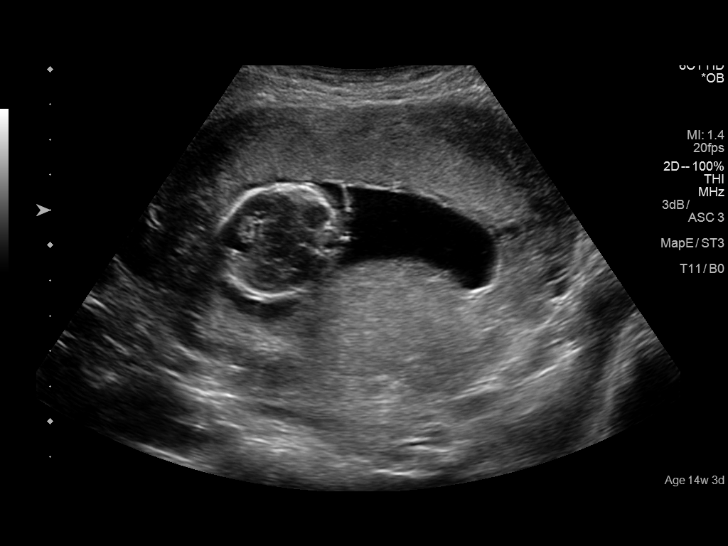
[im 13/23]
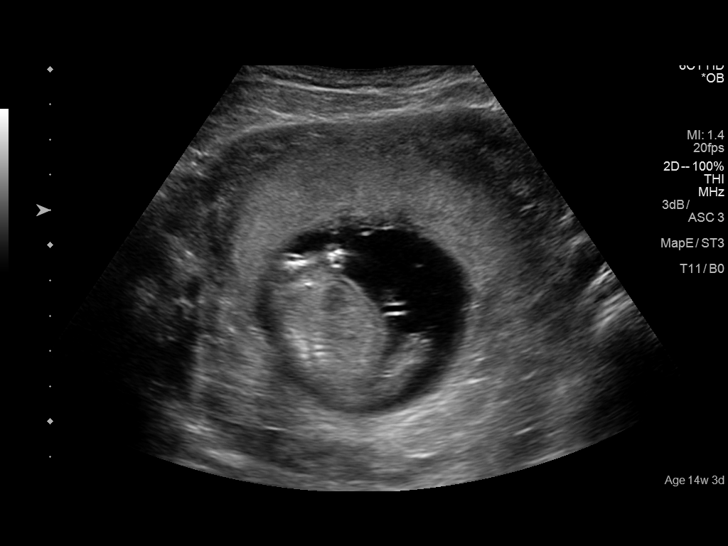
[im 14/23]
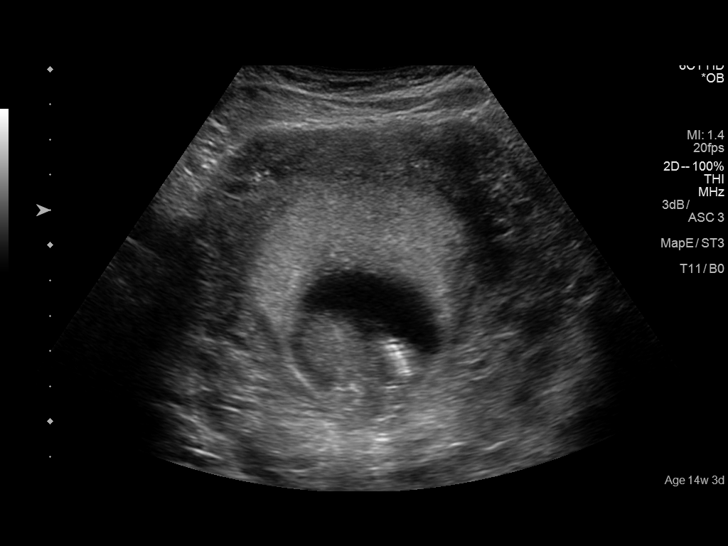
[im 16/23]
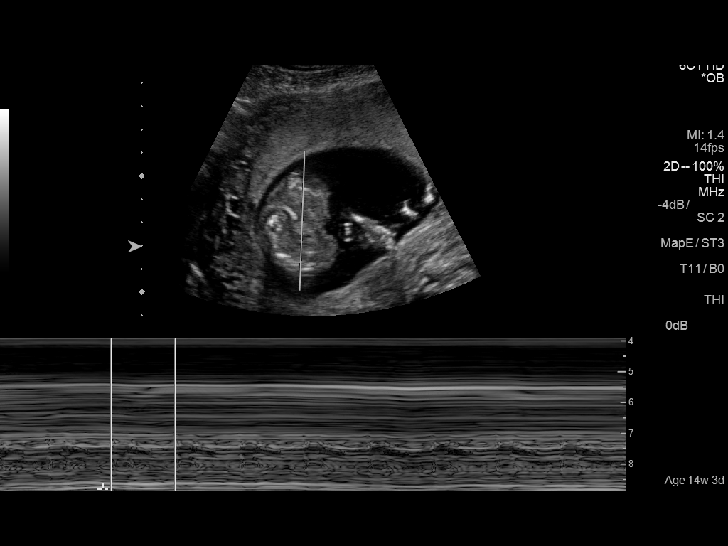
[im 18/23]
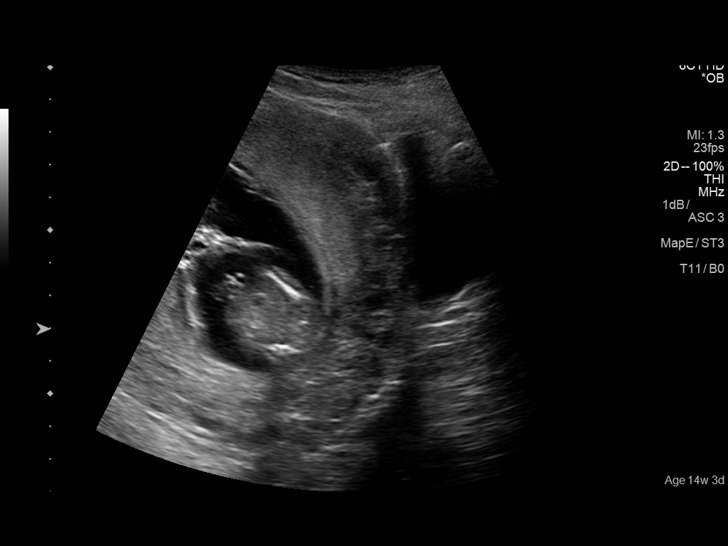
[im 19/23]
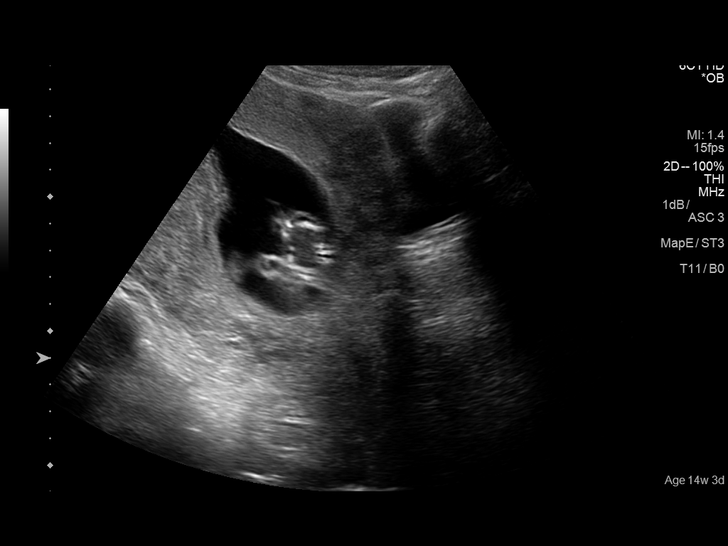
[im 21/23]
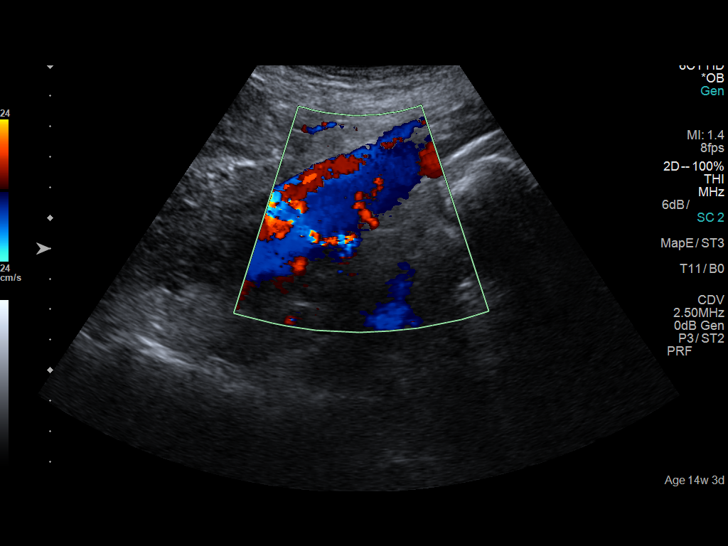
[im 23/23]
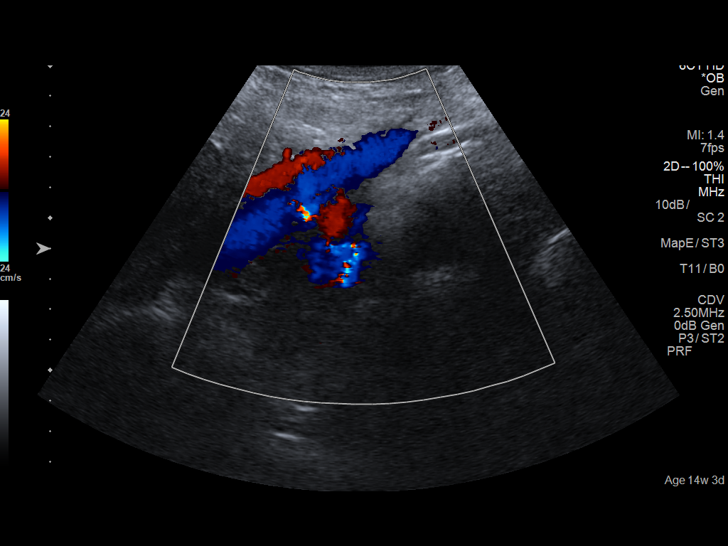

[14 of 23 positions shown; findings below may reference images not displayed]

FINDINGS: Number of Fetuses: 1

Heart Rate:  160 bpm

Movement: Yes

Presentation: Breech

Placental Location: Anterior

Previa: No, distance of placenta from cervix 3.04 cm

Amniotic Fluid (Subjective):  Within normal limits.

Femur length measures 1.6 cm corresponding to gestational age 14
weeks 4 days.

EDC by ultrasound 04/16/2016

MATERNAL FINDINGS:

Cervix:  Appears closed.  Cervical length measures 3.7 cm.

Uterus/Adnexae:  No abnormality visualized.
IMPRESSION: There is single live intrauterine gestation in breech presentation.
Femur length measures 1.6 cm corresponding to gestational age 14
weeks 4 days. There is no placenta previa. EDC by ultrasound
04/16/2016. Unremarkable adnexa. Fetal heart rate 160 BPM

This exam is performed on an emergent basis and does not
comprehensively evaluate fetal size, dating, or anatomy; follow-up
complete OB US should be considered if further fetal assessment is
warranted.

## 2017-02-04 ENCOUNTER — Emergency Department
Admission: EM | Admit: 2017-02-04 | Discharge: 2017-02-04 | Disposition: A | Discharge status: Home / Self Care | Payer: Medicaid Other | Attending: Internal Medicine | Admitting: Internal Medicine

## 2017-02-04 ENCOUNTER — Encounter: Payer: Self-pay | Admitting: Emergency Medicine

## 2017-02-04 DIAGNOSIS — K0889 Other specified disorders of teeth and supporting structures: Secondary | ICD-10-CM

## 2017-02-04 DIAGNOSIS — K029 Dental caries, unspecified: Secondary | ICD-10-CM | POA: Insufficient documentation

## 2017-02-04 DIAGNOSIS — Z87891 Personal history of nicotine dependence: Secondary | ICD-10-CM | POA: Insufficient documentation

## 2017-02-04 MED ORDER — PENICILLIN V POTASSIUM 500 MG PO TABS: 500.0000 mg | ORAL_TABLET | Freq: Four times a day (QID) | ORAL | 0 refills | Status: AC

## 2017-02-04 MED ORDER — IBUPROFEN 600 MG PO TABS: 600.0000 mg | ORAL_TABLET | Freq: Four times a day (QID) | ORAL | 0 refills | Status: AC | PRN

## 2017-02-04 MED ORDER — TRAMADOL HCL 50 MG PO TABS
50.0000 mg | ORAL_TABLET | Freq: Once | ORAL | Status: AC
  Administered 2017-02-04: 50 mg via ORAL
  Filled 2017-02-04: qty 1

## 2017-02-04 MED ORDER — TRAMADOL HCL 50 MG PO TABS: 50.0000 mg | ORAL_TABLET | Freq: Four times a day (QID) | ORAL | 0 refills | Status: AC | PRN

## 2017-02-04 MED ORDER — PENICILLIN V POTASSIUM 500 MG PO TABS
500.0000 mg | ORAL_TABLET | Freq: Once | ORAL | Status: AC
  Administered 2017-02-04: 500 mg via ORAL
  Filled 2017-02-04: qty 1

## 2017-02-04 NOTE — ED Triage Notes (Signed)
Patient reports tooth to left back broke off several days ago.  Reports pain from lower jaw up and reports "just feel bad".  Reports dental appointment next week.

## 2017-02-04 NOTE — ED Provider Notes (Signed)
[facility] Provider Note   CSN: [id number] Arrival date & time: 02/04/17  1905     History   Chief Complaint Chief Complaint  Patient presents with  . Headache  . Dental Problem    HPI Pamela Mcintyre is a 36 y.o. female presents to the emergency department for evaluation of 2 days of left lower dental pain. Patient believes she cracked her tooth and has an infection along the back lower tooth. She has a sharp pain with constant aching throughout the left lower jaw. She denies any fevers, difficulty swallowing, drainage, nausea, vomiting, headaches, chest pain or shortness of breath. She has been taking Tylenol and ibuprofen with mild improvement. Her pain is moderate. She has had minimal swelling to the left lower jaw.  HPI  Past Medical History:  Diagnosis Date  . Advanced maternal age in multigravida   . Bipolar disorder with current episode depressed ([place])   . Generalized anxiety disorder   . Irregular heart beat   . Marijuana abuse   . PTSD (post-traumatic stress disorder)     Patient Active Problem List   Diagnosis Date Noted  . Labor and delivery indication for care or intervention 03/27/2016  . Labor and delivery, indication for care 03/27/2016  . Indication for care in labor or delivery 03/21/2016  . Abnormal finding on antenatal screening of mother 11/05/2015  . Family history of chromosomal abnormality 11/05/2015    Past Surgical History:  Procedure Laterality Date  . NO PAST SURGERIES    . 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 Authorizing Provider  acetaminophen (TYLENOL) 325 MG tablet Take 650 mg by mouth every 6 (six) hours as needed.    [provider]  FLUoxetine (PROZAC) 20 MG tablet Take 60 mg by mouth daily. Reported on 12/21/2015    [provider]  ibuprofen (ADVIL,MOTRIN) 600 MG tablet Take 1 tablet (600 mg total) by mouth every 6 (six) hours as needed for moderate pain. 02/04/17   Duanne Guess, PA-C  oxyCODONE (OXY IR/ROXICODONE) 5 MG immediate release tablet Take 1 tablet (5 mg total) by mouth every 4 (four) hours as needed (pain scale 4-7). 03/29/16   Burlene Arnt, CNM  penicillin v potassium (VEETID) 500 MG tablet Take 1 tablet (500 mg total) by mouth 4 (four) times daily. 02/04/17   Duanne Guess, PA-C  Prenatal Vit-Fe Fumarate-FA (PRENATAL MULTIVITAMIN) TABS tablet Take 1 tablet by mouth daily at 12 noon.    [provider]  traMADol (ULTRAM) 50 MG tablet Take 1 tablet (50 mg total) by mouth every 6 (six) hours as needed. 02/04/17   Duanne Guess, PA-C    Family History Family History  Problem Relation Age of Onset  . Bipolar disorder Mother   . Hypertension Mother   . Thyroid disease Mother        Hyperthyroidism  . Diabetes Father   .  Hypertension Father     Social History Social History  Substance Use Topics  . Smoking status: Former Smoker    Packs/day: 0.25    Types: Cigarettes  . Smokeless tobacco: Never Used  . Alcohol use No     Allergies   Patient has no known allergies.   Review of Systems Review of Systems  Constitutional: Negative for activity change, chills, fatigue and fever.  HENT: Positive for dental problem. Negative for congestion, sinus pressure and sore throat.   Eyes: Negative for visual disturbance.  Musculoskeletal: Negative for arthralgias.  Skin: Negative for rash.  Neurological: Negative for weakness, numbness and headaches.  Hematological:  Negative for adenopathy.  Psychiatric/Behavioral: Negative for agitation, behavioral problems and confusion.     Physical Exam Updated Vital Signs BP (!) 147/91 (BP Location: Left Arm)   Pulse 91   Temp 98.1 F (36.7 C) (Oral)   Resp 20   Ht 5\' 4"  (1.626 m)   Wt 135 lb (61.2 kg)   LMP 01/06/2017 (Approximate)   SpO2 99%   BMI 23.17 kg/m   Physical Exam  Constitutional: She is oriented to person, place, and time. She appears well-developed and well-nourished. No distress.  HENT:  Head: Normocephalic and atraumatic.  Right Ear: External ear normal.  Left Ear: External ear normal.  Nose: Nose normal.  Mouth/Throat: Uvula is midline and oropharynx is clear and moist. No oral lesions. No trismus in the jaw. Abnormal dentition. Dental caries present. No dental abscesses or uvula swelling.    Eyes: EOM are normal.  Neck: Normal range of motion. Neck supple.  Cardiovascular: Normal rate.  Exam reveals no gallop and no friction rub.   No murmur heard. Pulmonary/Chest: Effort normal. No respiratory distress.  Neurological: She is alert and oriented to person, place, and time.  Skin: Skin is warm and dry.  Psychiatric: She has a normal mood and affect. Her behavior is normal. Thought content normal.     ED Treatments / Results  Labs (all labs ordered are listed, but only abnormal results are displayed) Labs Reviewed - No data to display  EKG  EKG Interpretation None       Radiology No results found.  Procedures Procedures (including critical care time)  Medications Ordered in ED Medications  traMADol (ULTRAM) tablet 50 mg (not administered)  penicillin v potassium (VEETID) tablet 500 mg (not administered)     Initial Impression / Assessment and Plan / ED Course  I have reviewed the triage vital signs and the nursing notes.  Pertinent labs & imaging results that were available during my care of the patient were reviewed by me and considered in my medical  decision making (see chart for details).     36 year old female with dental pain. She is placed on oral antibiotics, given ibuprofen and tramadol for pain. She will follow-up as needed. She is given information on dental clinic. She is educated on signs and symptoms return to the ED for.  Final Clinical Impressions(s) / ED Diagnoses   Final diagnoses:  Pain, dental    New Prescriptions New Prescriptions   IBUPROFEN (ADVIL,MOTRIN) 600 MG TABLET    Take 1 tablet (600 mg total) by mouth every 6 (six) hours as needed for moderate pain.   PENICILLIN V POTASSIUM (VEETID) 500 MG TABLET    Take 1 tablet (500 mg total) by mouth 4 (four) times daily.   TRAMADOL (ULTRAM) 50 MG TABLET    Take 1 tablet (50 mg total) by mouth every  6 (six) hours as needed.     Duanne Guess, PA-C 02/04/17 2001    Earleen Newport, MD 02/06/17 [phone]

## 2017-02-04 NOTE — Discharge Instructions (Signed)
Please take medications as prescribed and return to the emergency department for any worsening symptoms urgent changes in her health. Follow-up with dental clinic in 2 days.

## 2017-02-04 NOTE — ED Notes (Signed)
Pt presents to ED via POV with c/o L sided dental pain. Pt states had an appt with the dentist but wasn't able to go. States pain is radiating up the L side of her face, causing pain in her ear and around her eye. Pt is also noted to have nasal congestion at this time.

## 2017-02-04 NOTE — ED Notes (Signed)
NAD noted at time of D/C. Pt denies questions or concerns. Pt ambulatory to the lobby at this time.  

## 2017-10-17 ENCOUNTER — Other Ambulatory Visit: Payer: Self-pay | Admitting: Family Medicine

## 2021-09-19 ENCOUNTER — Emergency Department: Payer: Commercial Managed Care - PPO

## 2021-09-19 ENCOUNTER — Emergency Department
Admission: EM | Admit: 2021-09-19 | Discharge: 2021-09-19 | Disposition: A | Discharge status: Home / Self Care | Payer: Commercial Managed Care - PPO | Attending: Emergency Medicine | Admitting: Emergency Medicine

## 2021-09-19 ENCOUNTER — Encounter: Payer: Self-pay | Admitting: Emergency Medicine

## 2021-09-19 ENCOUNTER — Other Ambulatory Visit: Payer: Self-pay

## 2021-09-19 DIAGNOSIS — S01112A Laceration without foreign body of left eyelid and periocular area, initial encounter: Secondary | ICD-10-CM | POA: Diagnosis not present

## 2021-09-19 DIAGNOSIS — S0990XA Unspecified injury of head, initial encounter: Secondary | ICD-10-CM | POA: Diagnosis present

## 2021-09-19 DIAGNOSIS — Z5321 Procedure and treatment not carried out due to patient leaving prior to being seen by health care provider: Secondary | ICD-10-CM | POA: Diagnosis not present
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
## 2023-09-20 IMAGING — CT CT CERVICAL SPINE W/O CM
3 of 4 series · 12 of 33 positions shown, 14 images · non-contrast
Comparison: None.

CLINICAL DATA: Status post trauma.

EXAM:
CT CERVICAL SPINE WITHOUT CONTRAST
TECHNIQUE: Multidetector CT imaging of the cervical spine was performed without
intravenous contrast. Multiplanar CT image reconstructions were also
generated.

[Series 4: sagittal bone · sagittal · 0.40mm/px · 5 of 87 slices shown, 6 images]
[im 29/87  bone]
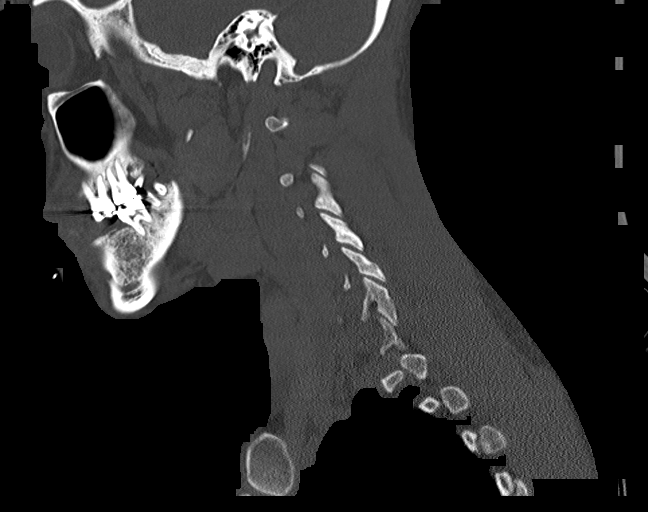
[im 36/87  bone]
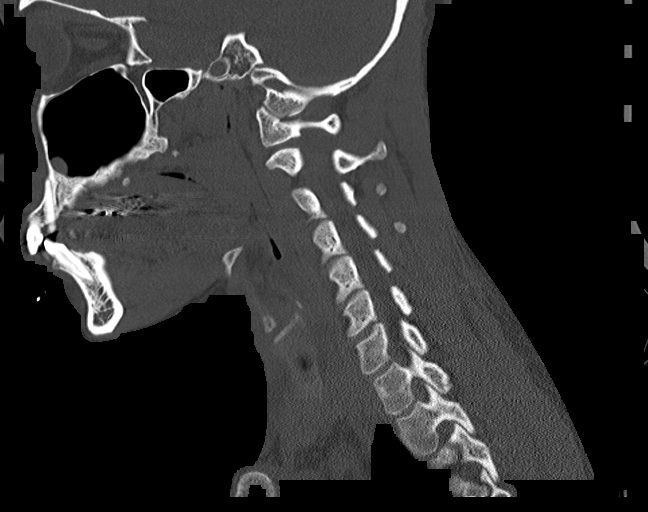
[im 44/87  soft-tissue]
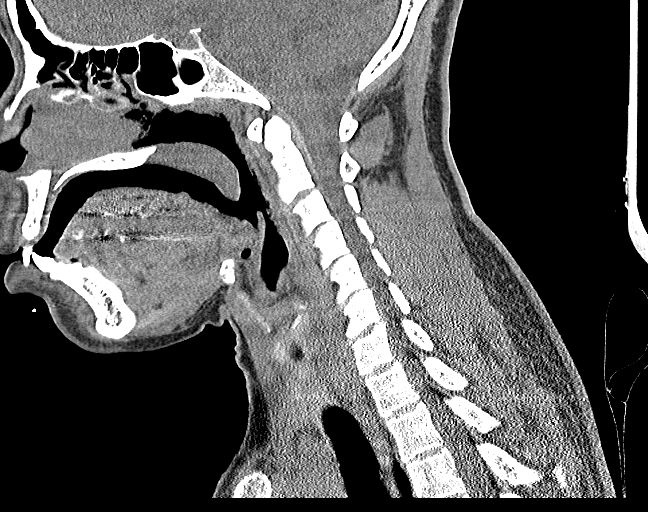
[im 44/87  bone]
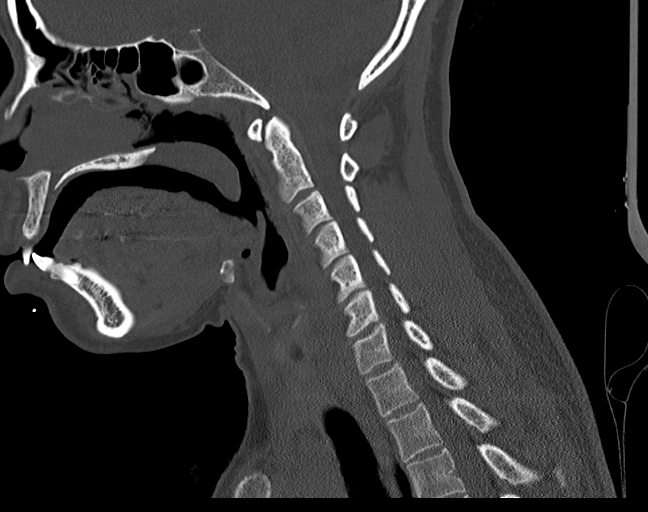
[im 51/87  bone]
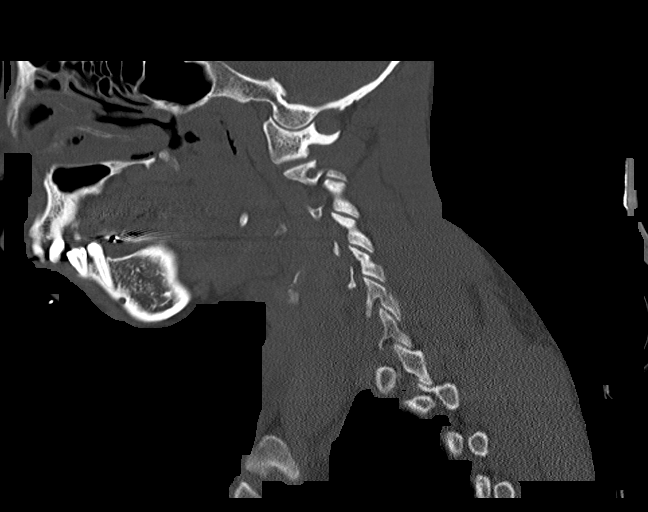
[im 58/87  bone]
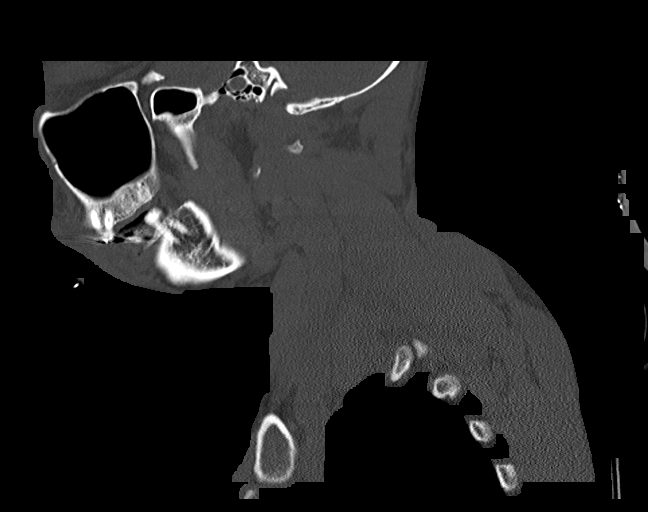

[Series 5: coronal bone · coronal · 0.45mm/px · 3 of 92 slices shown]
[im 27/92  bone]
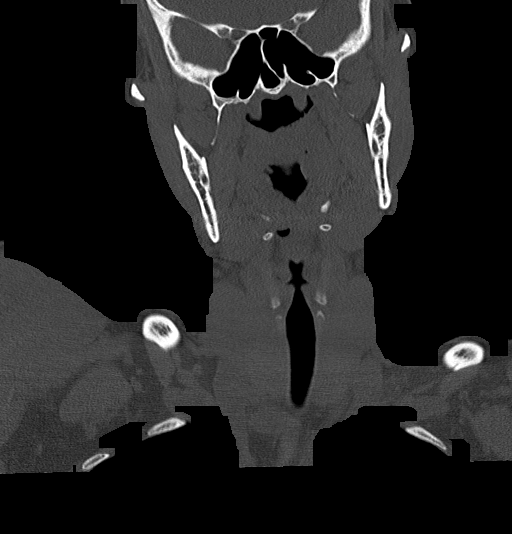
[im 40/92  bone]
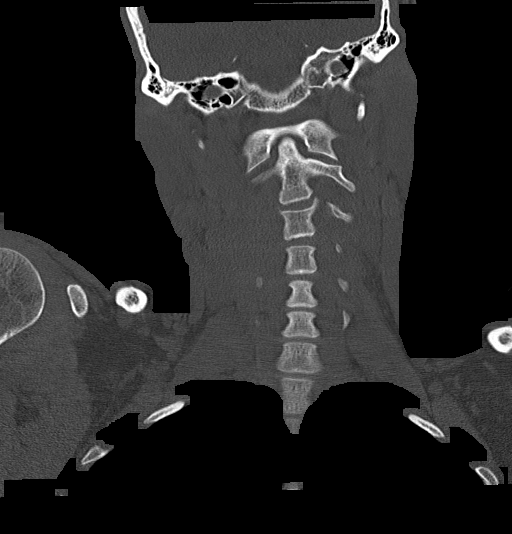
[im 53/92  bone]
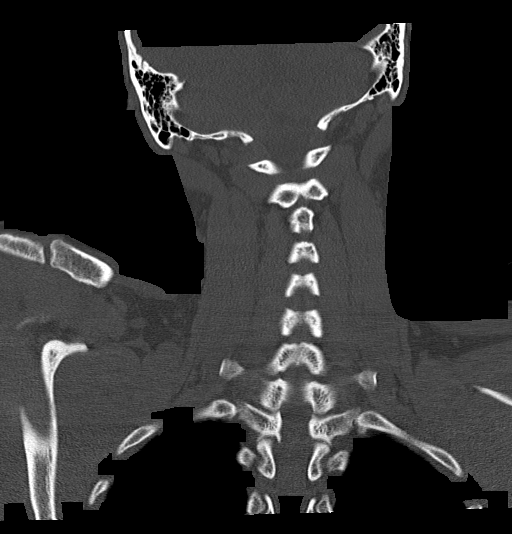

[Series 6: orthogonal axials · axial · 0.36mm/px · z∈[-319,-170]mm · 4 of 119 slices shown, 5 images]
[im 17/119  soft-tissue]
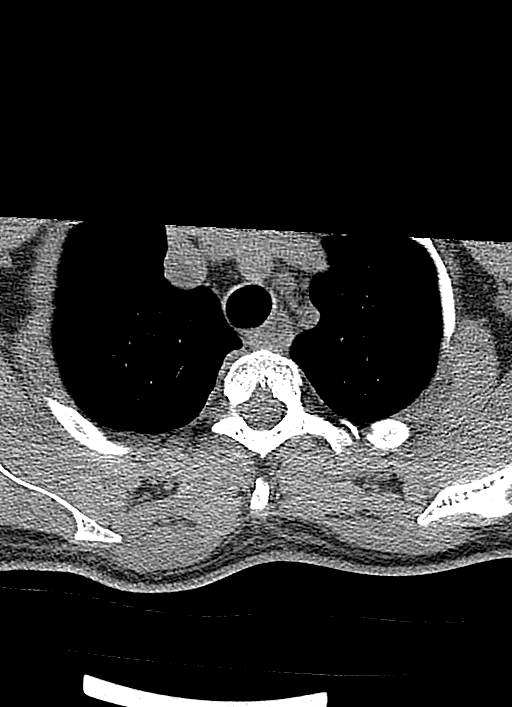
[im 17/119  bone]
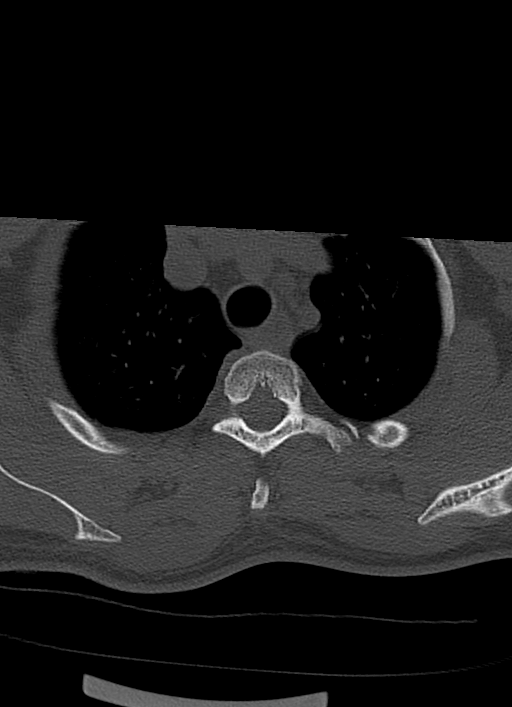
[im 51/119  bone]
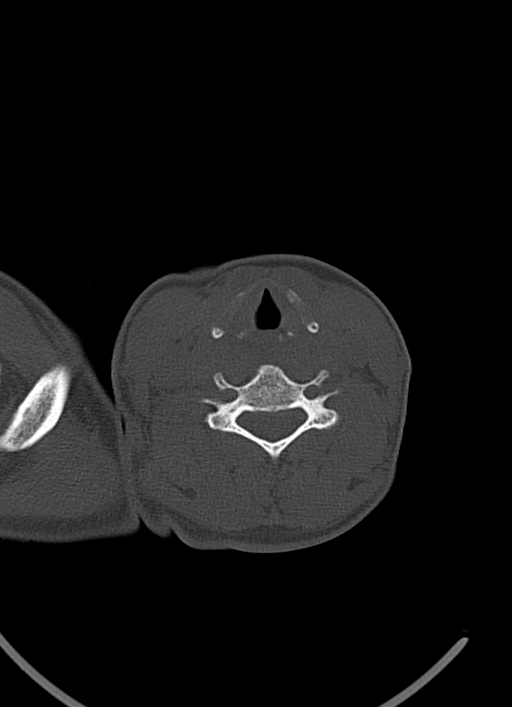
[im 68/119  bone]
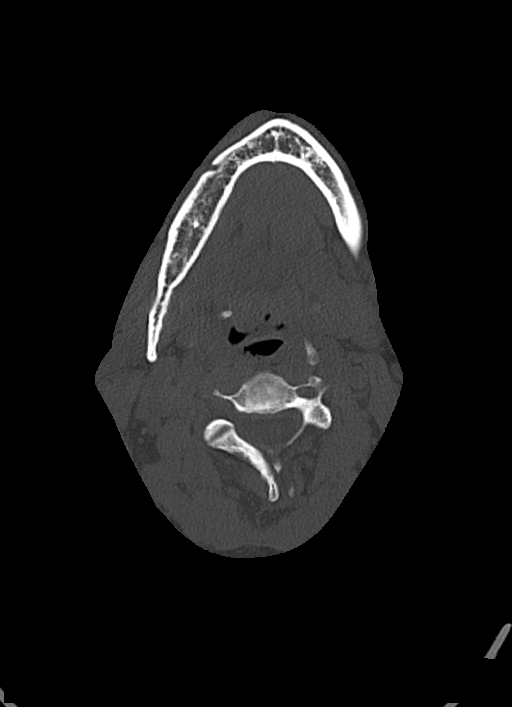
[im 102/119  bone]
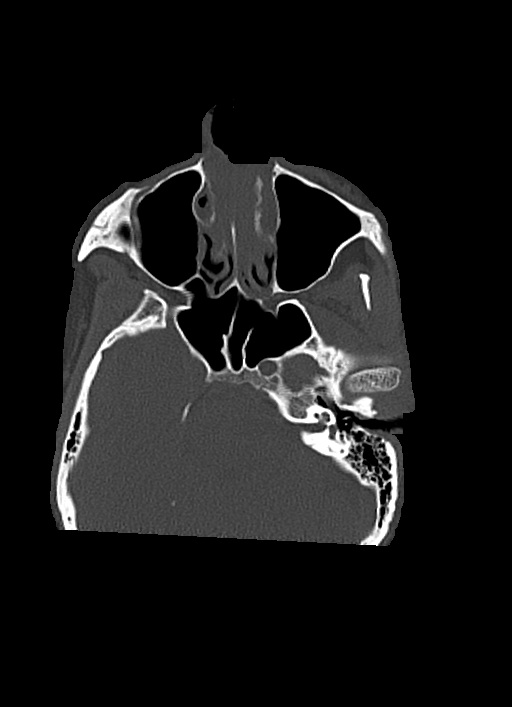

[12 of 33 positions shown; findings below may reference images not displayed]

FINDINGS: Alignment: Normal.

Skull base and vertebrae: No acute fracture. No primary bone lesion
or focal pathologic process.

Soft tissues and spinal canal: No prevertebral fluid or swelling. No
visible canal hematoma.

Disc levels: Normal multilevel endplates are seen with normal
multilevel intervertebral disc spaces.

Normal, bilateral multilevel facet joints are noted.

Upper chest: Negative.

Other: None.
IMPRESSION: No acute fracture or subluxation of the cervical spine.

## 2023-09-20 IMAGING — CT CT HEAD W/O CM
4 series · 16 of 47 positions shown, 18 images · non-contrast
Comparison: None.

CLINICAL DATA: Status post trauma.

EXAM:
CT HEAD WITHOUT CONTRAST
TECHNIQUE: Contiguous axial images were obtained from the base of the skull
through the vertex without intravenous contrast.

[Series 2: head wo · axial · 0.45mm/px · z∈[-91,+29]mm · 7 of 34 slices shown, 9 images]
[im 5/34  brain]
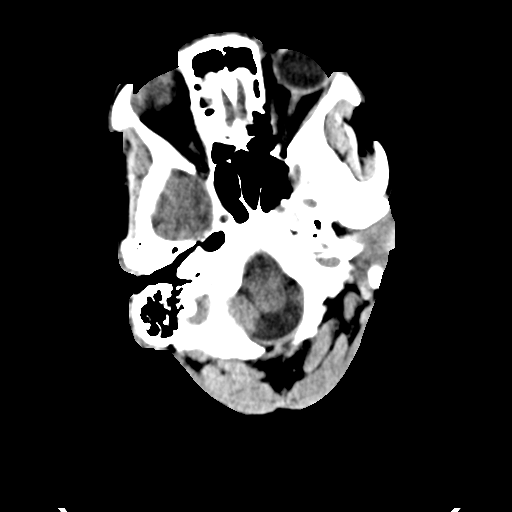
[im 5/34  bone]
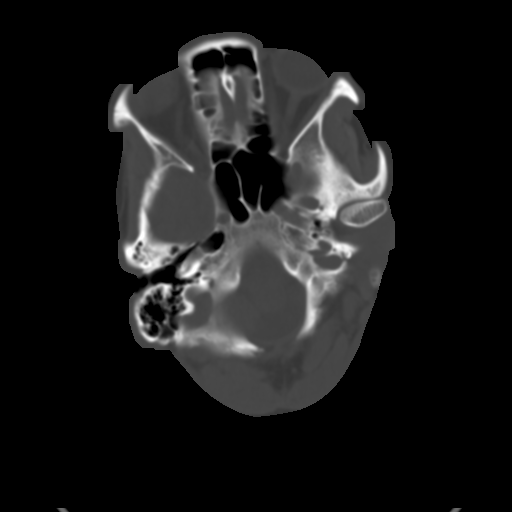
[im 9/34  brain]
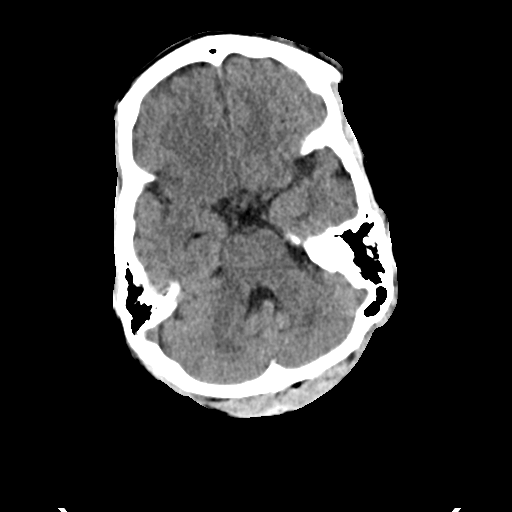
[im 13/34  brain]
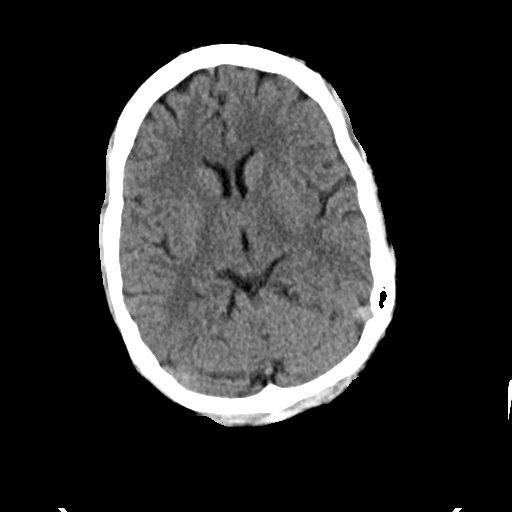
[im 17/34  brain]
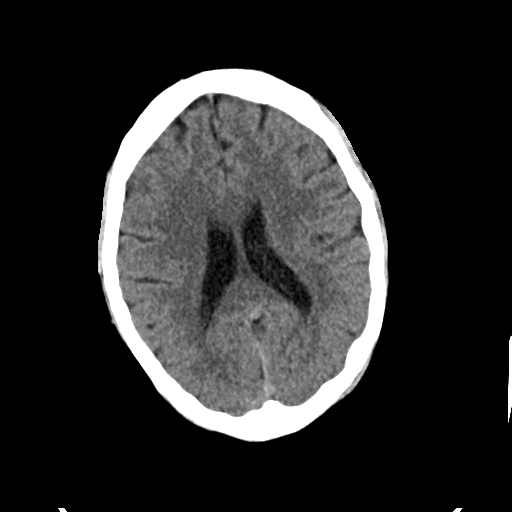
[im 21/34  brain]
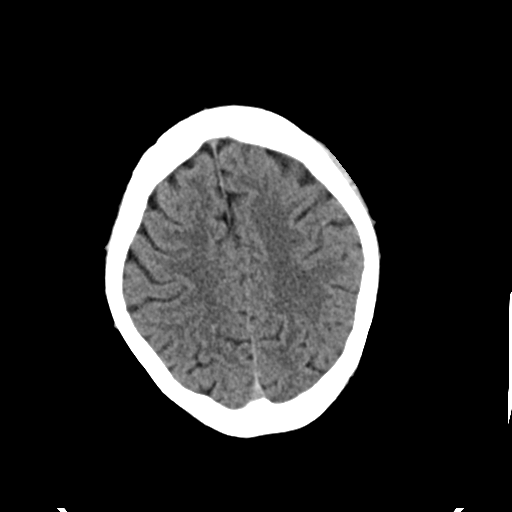
[im 21/34  bone]
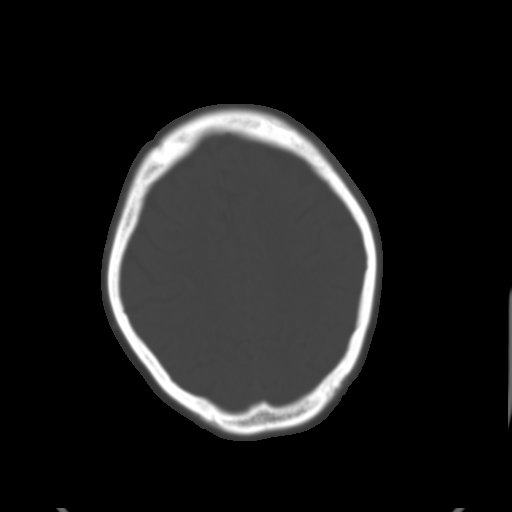
[im 25/34  brain]
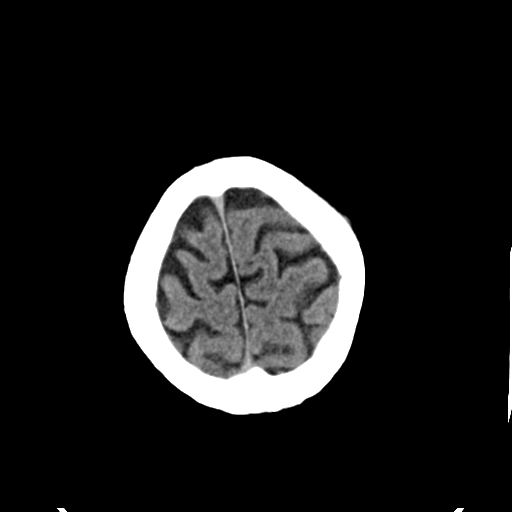
[im 29/34  brain]
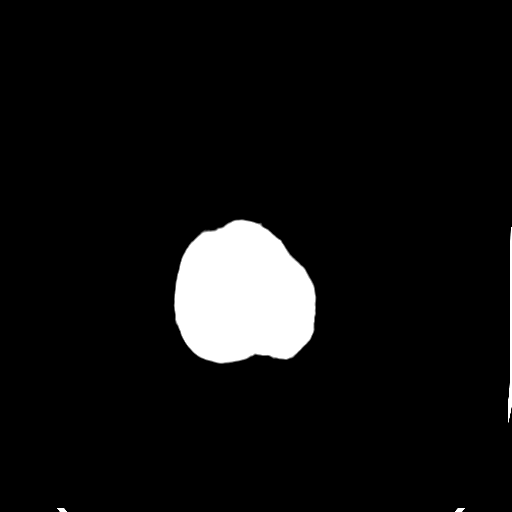

[Series 3: head bone · axial · 0.45mm/px · z∈[-95,-63]mm · 3 of 83 slices shown]
[im 9/83  bone]
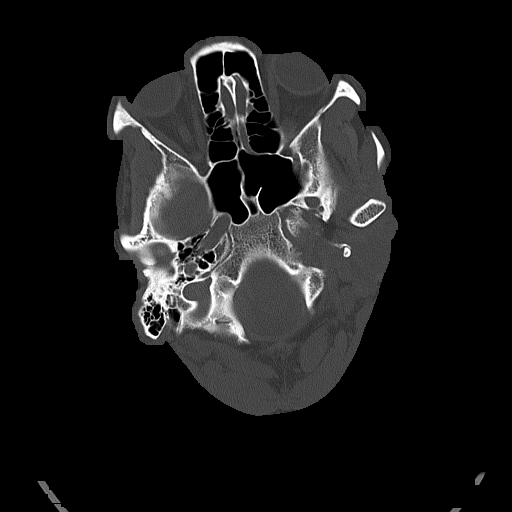
[im 17/83  bone]
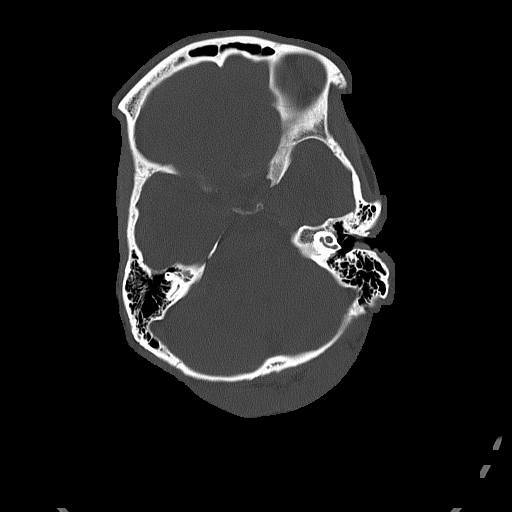
[im 25/83  bone]
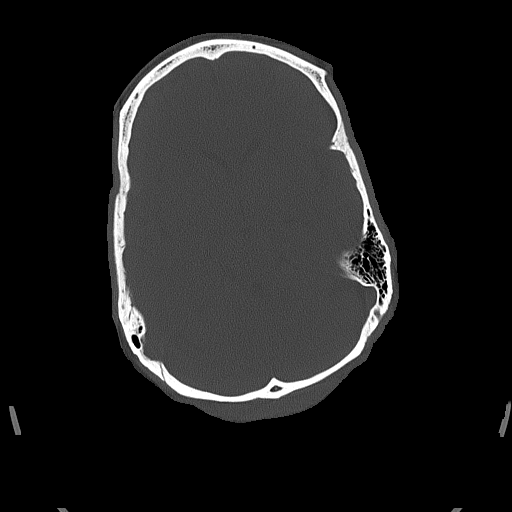

[Series 4: coronal soft tissue · coronal · 0.31mm/px · 3 of 71 slices shown]
[im 24/71  brain]
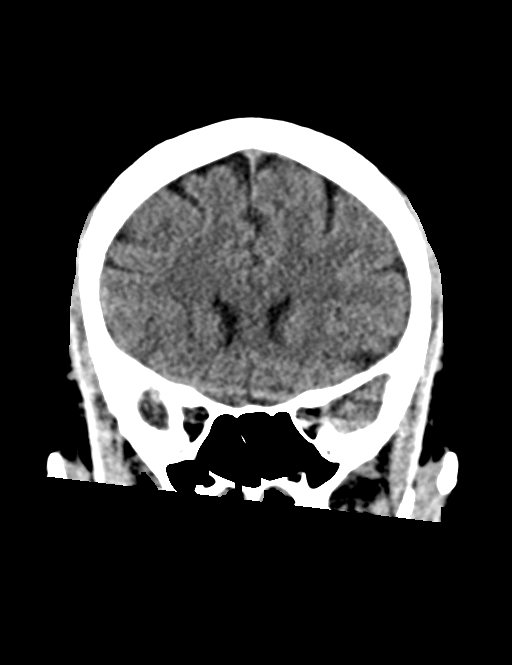
[im 32/71  brain]
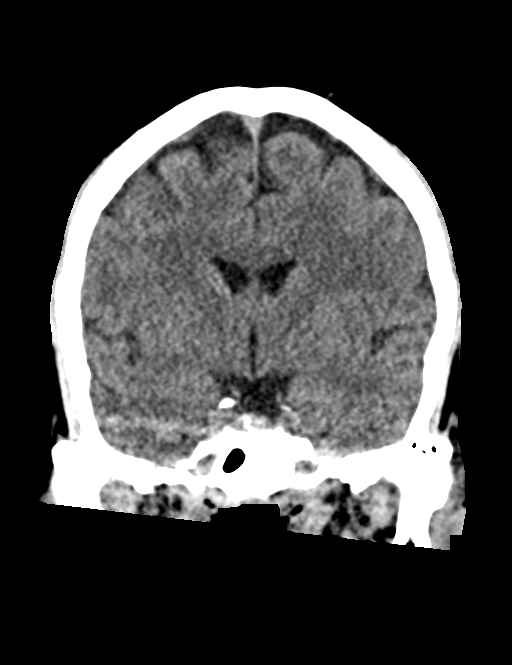
[im 39/71  brain]
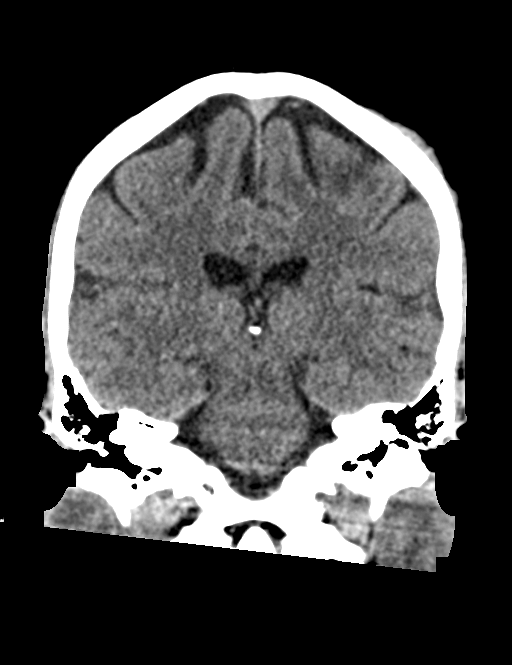

[Series 5: sagittal soft tissue · sagittal · 0.34mm/px · 3 of 48 slices shown]
[im 17/48  brain]
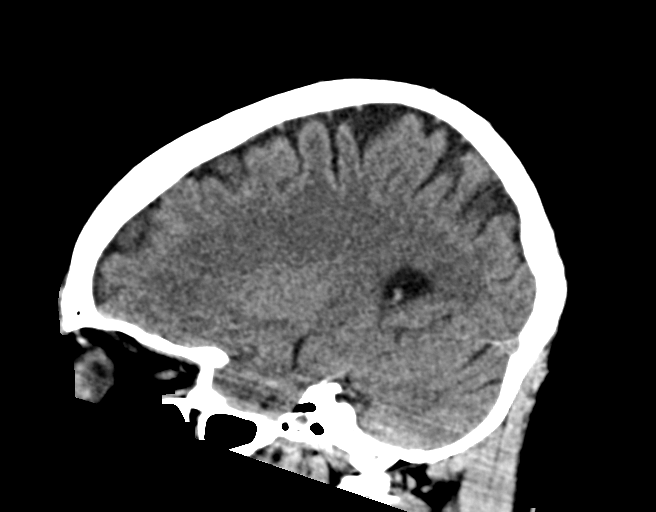
[im 24/48  brain]
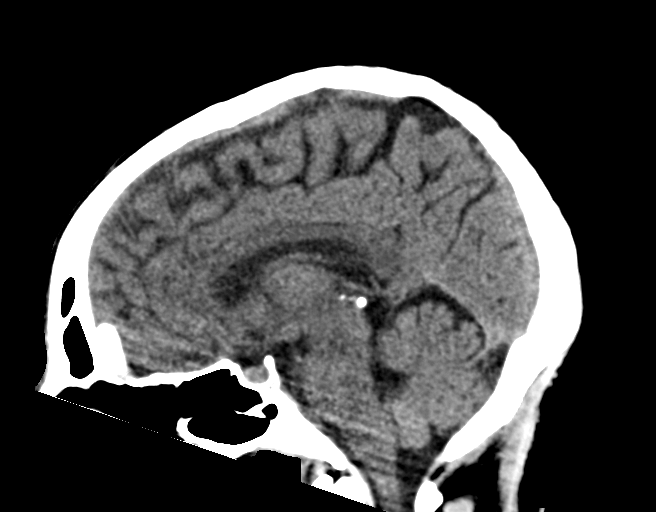
[im 32/48  brain]
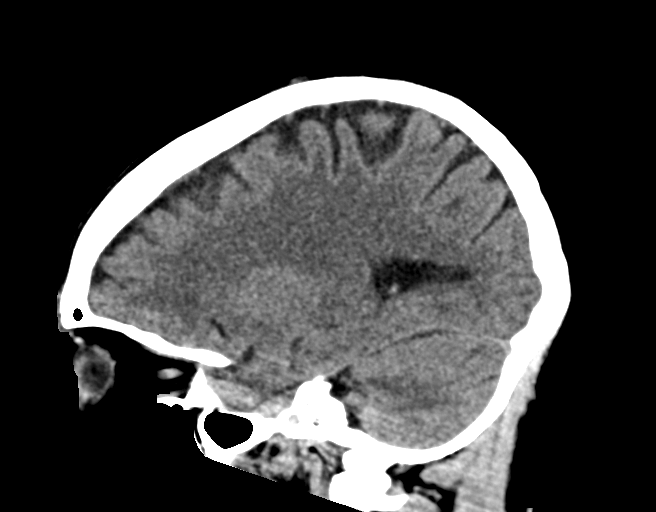

[16 of 47 positions shown; findings below may reference images not displayed]

FINDINGS: Brain: No evidence of acute infarction, hemorrhage, hydrocephalus,
extra-axial collection or mass lesion/mass effect.

Vascular: No hyperdense vessel or unexpected calcification.

Skull: Normal. Negative for fracture or focal lesion.

Sinuses/Orbits: No acute finding.

Other: There is mild left supra orbital and left frontal scalp soft
tissue swelling. An associated 1.1 cm scalp soft tissue defect is
seen.
IMPRESSION: 1. No acute intracranial abnormality.
2. Mild left supra orbital and left frontal scalp soft tissue
swelling with an associated soft tissue defect.
